# Patient Record
Sex: Female | Born: 1995 | Hispanic: Yes | Marital: Single | Smoking: Former smoker
Health system: Southern US, Community
[De-identification: ages and names within clinical notes are randomized; demographics above are authoritative.]

## PROBLEM LIST (undated history)

## (undated) DIAGNOSIS — R87619 Unspecified abnormal cytological findings in specimens from cervix uteri: Secondary | ICD-10-CM

## (undated) DIAGNOSIS — N912 Amenorrhea, unspecified: Secondary | ICD-10-CM

## (undated) HISTORY — PX: NO PAST SURGERIES: SHX2092

## (undated) HISTORY — PX: OTHER SURGICAL HISTORY: SHX169

## (undated) HISTORY — PX: COLPOSCOPY: SHX161

## (undated) HISTORY — DX: Unspecified abnormal cytological findings in specimens from cervix uteri: R87.619

## (undated) HISTORY — DX: Amenorrhea, unspecified: N91.2

---
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
## 2016-09-05 NOTE — L&D Delivery Note (Signed)
Delivery Note At 0431  a viable and healthy female """Autumn Williams""" was delivered via  (Presentation:OA ;  ).  APGAR: 8, 9  .   Placenta status:delivered intact  Cord:  with the following complications: mild boggy uterus resolved with 200 mcg cytotec placed reactally  Anesthesia:  epidural Episiotomy:  none Lacerations:  none Est. Blood Loss (mL):  350  Mom to postpartum.  Baby to Couplet care / Skin to Skin.  Jomar Denz N Seairra Otani 04/23/2017, 4:45 AM

## 2016-10-03 ENCOUNTER — Ambulatory Visit (INDEPENDENT_AMBULATORY_CARE_PROVIDER_SITE_OTHER): Payer: BLUE CROSS/BLUE SHIELD | Admitting: Certified Nurse Midwife

## 2016-10-03 ENCOUNTER — Telehealth: Payer: Self-pay

## 2016-10-03 VITALS — BP 125/84 | HR 82 | Ht 64.0 in | Wt 169.2 lb

## 2016-10-03 DIAGNOSIS — Z3401 Encounter for supervision of normal first pregnancy, first trimester: Secondary | ICD-10-CM

## 2016-10-03 DIAGNOSIS — Z3687 Encounter for antenatal screening for uncertain dates: Secondary | ICD-10-CM

## 2016-10-03 DIAGNOSIS — Z1389 Encounter for screening for other disorder: Secondary | ICD-10-CM

## 2016-10-03 DIAGNOSIS — N912 Amenorrhea, unspecified: Secondary | ICD-10-CM

## 2016-10-03 DIAGNOSIS — Z113 Encounter for screening for infections with a predominantly sexual mode of transmission: Secondary | ICD-10-CM

## 2016-10-03 NOTE — Progress Notes (Signed)
Mayer MaskerBrenda Bibb presents for NOB nurse interview visit. Pregnancy confirmation done by [facility] on 09/12/2016. UPT: positive.  G-1.  P-0. Pregnancy education material explained and given. Her room mate has a cat in the home but she does not change the litter box.  NOB labs ordered.  HIV labs and Drug screen were explained optional and she did not decline. Drug screen ordered. PNV encouraged. Genetic screening options discussed and they are undecided. Ultrasound ordered due to unsure of LMP.  Pt. To follow up with provider in 2 weeks for NOB physical.  All questions answered.

## 2016-10-03 NOTE — Patient Instructions (Addendum)
Pregnancy and Zika Virus Disease Introduction Zika virus disease, or Zika, is an illness that can spread to people from mosquitoes that carry the virus. It may also spread from person to person through infected body fluids. Zika first occurred in [place], but recently it has spread to new areas. The virus occurs in tropical climates. The location of Zika continues to change. Most people who become infected with Zika virus do not develop serious illness. However, Zika may cause birth defects in an unborn baby whose mother is infected with the virus. It may also increase the risk of miscarriage. What are the symptoms of Zika virus disease? In many cases, people who have been infected with Zika virus do not develop any symptoms. If symptoms appear, they usually start about a week after the person is infected. Symptoms are usually mild. They may include:  Fever.  Rash.  Red eyes.  Joint pain. How does Zika virus disease spread? The main way that Zika virus spreads is through the bite of a certain type of mosquito. Unlike most types of mosquitos, which bite only at night, the type of mosquito that carries Zika virus bites both at night and during the day. Zika virus can also spread through sexual contact, through a blood transfusion, and from a mother to her baby before or during birth. Once you have had Zika virus disease, it is unlikely that you will get it again. Can I pass Zika to my baby during pregnancy? Yes, Zika can pass from a mother to her baby before or during birth. What problems can Zika cause for my baby? A woman who is infected with Zika virus while pregnant is at risk of having her baby born with a condition in which the brain or head is smaller than expected (microcephaly). Babies who have microcephaly can have developmental delays, seizures, hearing problems, and vision problems. Having Zika virus disease during pregnancy can also increase the risk of miscarriage. How can Zika  virus disease be prevented? There is no vaccine to prevent Zika. The best way to prevent the disease is to avoid infected mosquitoes and avoid exposure to body fluids that can spread the virus. Avoid any possible exposure to Zika by taking the following precautions. For women and their sex partners:  Avoid traveling to high-risk areas. The locations where Zika is being reported change often. To identify high-risk areas, check the CDC travel website: www.cdc.gov/zika/geo/index.html  If you or your sex partner must travel to a high-risk area, talk with a health care provider before and after traveling.  Take all precautions to avoid mosquito bites if you live in, or travel to, any of the high-risk areas. Insect repellents are safe to use during pregnancy.  Ask your health care provider when it is safe to have sexual contact. For women:  If you are pregnant or trying to become pregnant, avoid sexual contact with persons who may have been exposed to Zika virus, persons who have possible symptoms of Zika, or persons whose history you are unsure about. If you choose to have sexual contact with someone who may have been exposed to Zika virus, use condoms correctly during the entire duration of sexual activity, every time. Do not share sexual devices, as you may be exposed to body fluids.  Ask your health care provider about when it is safe to attempt pregnancy after a possible exposure to Zika virus. What steps should I take to avoid mosquito bites? Take these steps to avoid mosquito bites when you   are in a high-risk area:  Wear loose clothing that covers your arms and legs.  Limit your outdoor activities.  Do not open windows unless they have window screens.  Sleep under mosquito nets.  Use insect repellent. The best insect repellents have:  DEET, picaridin, oil of lemon eucalyptus (OLE), or IR3535 in them.  Higher amounts of an active ingredient in them.  Remember that insect repellents  are safe to use during pregnancy.  Do not use OLE on children who are younger than 3 years of age. Do not use insect repellent on babies who are younger than 2 months of age.  Cover your child's stroller with mosquito netting. Make sure the netting fits snugly and that any loose netting does not cover your child's mouth or nose. Do not use a blanket as a mosquito-protection cover.  Do not apply insect repellent underneath clothing.  If you are using sunscreen, apply the sunscreen before applying the insect repellent.  Treat clothing with permethrin. Do not apply permethrin directly to your skin. Follow label directions for safe use.  Get rid of standing water, where mosquitoes may reproduce. Standing water is often found in items such as buckets, bowls, animal food dishes, and flowerpots. When you return from traveling to any high-risk area, continue taking actions to protect yourself against mosquito bites for 3 weeks, even if you show no signs of illness. This will prevent spreading Zika virus to uninfected mosquitoes. What should I know about the sexual transmission of Zika? People can spread Zika to their sexual partners during vaginal, anal, or oral sex, or by sharing sexual devices. Many people with Zika do not develop symptoms, so a person could spread the disease without knowing that they are infected. The greatest risk is to women who are pregnant or who may become pregnant. Zika virus can live longer in semen than it can live in blood. Couples can prevent sexual transmission of the virus by:  Using condoms correctly during the entire duration of sexual activity, every time. This includes vaginal, anal, and oral sex.  Not sharing sexual devices. Sharing increases your risk of being exposed to body fluid from another person.  Avoiding all sexual activity until your health care provider says it is safe. Should I be tested for Zika virus? A sample of your blood can be tested for Zika  virus. A pregnant woman should be tested if she may have been exposed to the virus or if she has symptoms of Zika. She may also have additional tests done during her pregnancy, such ultrasound testing. Talk with your health care provider about which tests are recommended. This information is not intended to replace advice given to you by your health care provider. Make sure you discuss any questions you have with your health care provider. Document Released: 05/13/2015 Document Revised: 01/28/2016 Document Reviewed: 05/06/2015  2017 Elsevier Minor Illnesses and Medications in Pregnancy  Cold/Flu:  Sudafed for congestion- Robitussin (plain) for cough- Tylenol for discomfort.  Please follow the directions on the label.  Try not to take any more than needed.  OTC Saline nasal spray and air humidifier or cool-mist  Vaporizer to sooth nasal irritation and to loosen congestion.  It is also important to increase intake of non carbonated fluids, especially if you have a fever.  Constipation:  Colace-2 capsules at bedtime; Metamucil- follow directions on label; Senokot- 1 tablet at bedtime.  Any one of these medications can be used.  It is also very important to increase   fluids and fruits along with regular exercise.  If problem persists please call the office.  Diarrhea:  Kaopectate as directed on the label.  Eat a bland diet and increase fluids.  Avoid highly seasoned foods.  Headache:  Tylenol 1 or 2 tablets every 3-4 hours as needed  Indigestion:  Maalox, Mylanta, Tums or Rolaids- as directed on label.  Also try to eat small meals and avoid fatty, greasy or spicy foods.  Nausea with or without Vomiting:  Nausea in pregnancy is caused by increased levels of hormones in the body which influence the digestive system and cause irritation when stomach acids accumulate.  Symptoms usually subside after 1st trimester of pregnancy.  Try the following: 1. Keep saltines, graham crackers or dry toast by your bed to  eat upon awakening. 2. Don't let your stomach get empty.  Try to eat 5-6 small meals per day instead of 3 large ones. 3. Avoid greasy fatty or highly seasoned foods.  4. Take OTC Unisom 1 tablet at bed time along with OTC Vitamin B6 25-50 mg 3 times per day.    If nausea continues with vomiting and you are unable to keep down food and fluids you may need a prescription medication.  Please notify your provider.   Sore throat:  Chloraseptic spray, throat lozenges and or plain Tylenol.  Vaginal Yeast Infection:  OTC Monistat for 7 days as directed on label.  If symptoms do not resolve within a week notify provider.  If any of the above problems do not subside with recommended treatment please call the office for further assistance.   Do not take Aspirin, Advil, Motrin or Ibuprofen.  * * OTC= Over the counter Hyperemesis Gravidarum Hyperemesis gravidarum is a severe form of nausea and vomiting that happens during pregnancy. Hyperemesis is worse than morning sickness. It may cause you to have nausea or vomiting all day for many days. It may keep you from eating and drinking enough food and liquids. Hyperemesis usually occurs during the first half (the first 20 weeks) of pregnancy. It often goes away once a woman is in her second half of pregnancy. However, sometimes hyperemesis continues through an entire pregnancy. What are the causes? The cause of this condition is not known. It may be related to changes in chemicals (hormones) in the body during pregnancy, such as the high level of pregnancy hormone (human chorionic gonadotropin) or the increase in the female sex hormone (estrogen). What are the signs or symptoms? Symptoms of this condition include:  Severe nausea and vomiting.  Nausea that does not go away.  Vomiting that does not allow you to keep any food down.  Weight loss.  Body fluid loss (dehydration).  Having no desire to eat, or not liking food that you have previously  enjoyed. How is this diagnosed? This condition may be diagnosed based on:  A physical exam.  Your medical history.  Your symptoms.  Blood tests.  Urine tests. How is this treated? This condition may be managed with medicine. If medicines to do not help relieve nausea and vomiting, you may need to receive fluids through an IV tube at the hospital. Follow these instructions at home:  Take over-the-counter and prescription medicines only as told by your health care provider.  Avoid iron pills and multivitamins that contain iron for the first 3-4 months of pregnancy. If you take prescription iron pills, do not stop taking them unless your health care provider approves.  Take the following actions to help   prevent nausea and vomiting:  In the morning, before getting out of bed, try eating a couple of dry crackers or a piece of toast.  Avoid foods and smells that upset your stomach. Fatty and spicy foods may make nausea worse.  Eat 5-6 small meals a day.  Do not drink fluids while eating meals. Drink between meals.  Eat or suck on things that have ginger in them. Ginger can help relieve nausea.  Avoid food preparation. The smell of food can spoil your appetite or trigger nausea.  Follow instructions from your health care provider about eating or drinking restrictions.  For snacks, eat high-protein foods, such as cheese.  Keep all follow-up and pre-birth (prenatal) visits as told by your health care provider. This is important. Contact a health care provider if:  You have pain in your abdomen.  You have a severe headache.  You have vision problems.  You are losing weight. Get help right away if:  You cannot drink fluids without vomiting.  You vomit blood.  You have constant nausea and vomiting.  You are very weak.  You are very thirsty.  You feel dizzy.  You faint.  You have a fever or other symptoms that last for more than 2-3 days.  You have a fever and  your symptoms suddenly get worse. Summary  Hyperemesis gravidarum is a severe form of nausea and vomiting that happens during pregnancy.  Making some changes to your eating habits may help relieve nausea and vomiting.  This condition may be managed with medicine.  If medicines to do not help relieve nausea and vomiting, you may need to receive fluids through an IV tube at the hospital. This information is not intended to replace advice given to you by your health care provider. Make sure you discuss any questions you have with your health care provider. Document Released: 08/22/2005 Document Revised: 04/20/2016 Document Reviewed: 04/20/2016 Elsevier Interactive Patient Education  2017 [employer] First Trimester of Pregnancy The first trimester of pregnancy is from week 1 until the end of week 12 (months 1 through 3). During this time, your baby will begin to develop inside you. At 6-8 weeks, the eyes and face are formed, and the heartbeat can be seen on ultrasound. At the end of 12 weeks, all the baby's organs are formed. Prenatal care is all the medical care you receive before the birth of your baby. Make sure you get good prenatal care and follow all of your doctor's instructions. Follow these instructions at home: Medicines  Take medicine only as told by your doctor. Some medicines are safe and some are not during pregnancy.  Take your prenatal vitamins as told by your doctor.  Take medicine that helps you poop (stool softener) as needed if your doctor says it is okay. Diet  Eat regular, healthy meals.  Your doctor will tell you the amount of weight gain that is right for you.  Avoid raw meat and uncooked cheese.  If you feel sick to your stomach (nauseous) or throw up (vomit):  Eat 4 or 5 small meals a day instead of 3 large meals.  Try eating a few soda crackers.  Drink liquids between meals instead of during meals.  If you have a hard time pooping  (constipation):  Eat high-fiber foods like fresh vegetables, fruit, and whole grains.  Drink enough fluids to keep your pee (urine) clear or pale yellow. Activity and Exercise  Exercise only as told by your doctor. Stop exercising if   you have cramps or pain in your lower belly (abdomen) or low back.  Try to avoid standing for long periods of time. Move your legs often if you must stand in one place for a long time.  Avoid heavy lifting.  Wear low-heeled shoes. Sit and stand up straight.  You can have sex unless your doctor tells you not to. Relief of Pain or Discomfort  Wear a good support bra if your breasts are sore.  Take warm water baths (sitz baths) to soothe pain or discomfort caused by hemorrhoids. Use hemorrhoid cream if your doctor says it is okay.  Rest with your legs raised if you have leg cramps or low back pain.  Wear support hose if you have puffy, bulging veins (varicose veins) in your legs. Raise (elevate) your feet for 15 minutes, 3-4 times a day. Limit salt in your diet. Prenatal Care  Schedule your prenatal visits by the twelfth week of pregnancy.  Write down your questions. Take them to your prenatal visits.  Keep all your prenatal visits as told by your doctor. Safety  Wear your seat belt at all times when driving.  Make a list of emergency phone numbers. The list should include numbers for family, friends, the hospital, and police and fire departments. General Tips  Ask your doctor for a referral to a local prenatal class. Begin classes no later than at the start of month 6 of your pregnancy.  Ask for help if you need counseling or help with nutrition. Your doctor can give you advice or tell you where to go for help.  Do not use hot tubs, steam rooms, or saunas.  Do not douche or use tampons or scented sanitary pads.  Do not cross your legs for long periods of time.  Avoid litter boxes and soil used by cats.  Avoid all smoking, herbs, and  alcohol. Avoid drugs not approved by your doctor.  Do not use any tobacco products, including cigarettes, chewing tobacco, and electronic cigarettes. If you need help quitting, ask your doctor. You may get counseling or other support to help you quit.  Visit your dentist. At home, brush your teeth with a soft toothbrush. Be gentle when you floss. Get help if:  You are dizzy.  You have mild cramps or pressure in your lower belly.  You have a nagging pain in your belly area.  You continue to feel sick to your stomach, throw up, or have watery poop (diarrhea).  You have a bad smelling fluid coming from your vagina.  You have pain with peeing (urination).  You have increased puffiness (swelling) in your face, hands, legs, or ankles. Get help right away if:  You have a fever.  You are leaking fluid from your vagina.  You have spotting or bleeding from your vagina.  You have very bad belly cramping or pain.  You gain or lose weight rapidly.  You throw up blood. It may look like coffee grounds.  You are around people who have German measles, fifth disease, or chickenpox.  You have a very bad headache.  You have shortness of breath.  You have any kind of trauma, such as from a fall or a car accident. This information is not intended to replace advice given to you by your health care provider. Make sure you discuss any questions you have with your health care provider. Document Released: 02/08/2008 Document Revised: 01/28/2016 Document Reviewed: 07/02/2013 Elsevier Interactive Patient Education  2017 [employer] Inc. Commonly Asked Questions   During Pregnancy  Cats: A parasite can be excreted in cat feces.  To avoid exposure you need to have another person empty the little box.  If you must empty the litter box you will need to wear gloves.  Wash your hands after handling your cat.  This parasite can also be found in raw or undercooked meat so this should also be avoided.  Colds,  Sore Throats, Flu: Please check your medication sheet to see what you can take for symptoms.  If your symptoms are unrelieved by these medications please call the office.  Dental Work: Most any dental work your dentist recommends is permitted.  X-rays should only be taken during the first trimester if absolutely necessary.  Your abdomen should be shielded with a lead apron during all x-rays.  Please notify your provider prior to receiving any x-rays.  Novocaine is fine; gas is not recommended.  If your dentist requires a note from us prior to dental work please call the office and we will provide one for you.  Exercise: Exercise is an important part of staying healthy during your pregnancy.  You may continue most exercises you were accustomed to prior to pregnancy.  Later in your pregnancy you will most likely notice you have difficulty with activities requiring balance like riding a bicycle.  It is important that you listen to your body and avoid activities that put you at a higher risk of falling.  Adequate rest and staying well hydrated are a must!  If you have questions about the safety of specific activities ask your provider.    Exposure to Children with illness: Try to avoid obvious exposure; report any symptoms to us when noted,  If you have chicken pos, red measles or mumps, you should be immune to these diseases.   Please do not take any vaccines while pregnant unless you have checked with your OB provider.  Fetal Movement: After 28 weeks we recommend you do "kick counts" twice daily.  Lie or sit down in a calm quiet environment and count your baby movements "kicks".  You should feel your baby at least 10 times per hour.  If you have not felt 10 kicks within the first hour get up, walk around and have something sweet to eat or drink then repeat for an additional hour.  If count remains less than 10 per hour notify your provider.  Fumigating: Follow your pest control agent's advice as to how long  to stay out of your home.  Ventilate the area well before re-entering.  Hemorrhoids:   Most over-the-counter preparations can be used during pregnancy.  Check your medication to see what is safe to use.  It is important to use a stool softener or fiber in your diet and to drink lots of liquids.  If hemorrhoids seem to be getting worse please call the office.   Hot Tubs:  Hot tubs Jacuzzis and saunas are not recommended while pregnant.  These increase your internal body temperature and should be avoided.  Intercourse:  Sexual intercourse is safe during pregnancy as long as you are comfortable, unless otherwise advised by your provider.  Spotting may occur after intercourse; report any bright red bleeding that is heavier than spotting.  Labor:  If you know that you are in labor, please go to the hospital.  If you are unsure, please call the office and let us help you decide what to do.  Lifting, straining, etc:  If your job requires heavy lifting or   straining please check with your provider for any limitations.  Generally, you should not lift items heavier than that you can lift simply with your hands and arms (no back muscles)  Painting:  Paint fumes do not harm your pregnancy, but may make you ill and should be avoided if possible.  Latex or water based paints have less odor than oils.  Use adequate ventilation while painting.  Permanents & Hair Color:  Chemicals in hair dyes are not recommended as they cause increase hair dryness which can increase hair loss during pregnancy.  " Highlighting" and permanents are allowed.  Dye may be absorbed differently and permanents may not hold as well during pregnancy.  Sunbathing:  Use a sunscreen, as skin burns easily during pregnancy.  Drink plenty of fluids; avoid over heating.  Tanning Beds:  Because their possible side effects are still unknown, tanning beds are not recommended.  Ultrasound Scans:  Routine ultrasounds are performed at approximately 20  weeks.  You will be able to see your baby's general anatomy an if you would like to know the gender this can usually be determined as well.  If it is questionable when you conceived you may also receive an ultrasound early in your pregnancy for dating purposes.  Otherwise ultrasound exams are not routinely performed unless there is a medical necessity.  Although you can request a scan we ask that you pay for it when conducted because insurance does not cover " patient request" scans.  Work: If your pregnancy proceeds without complications you may work until your due date, unless your physician or employer advises otherwise.  Round Ligament Pain/Pelvic Discomfort:  Sharp, shooting pains not associated with bleeding are fairly common, usually occurring in the second trimester of pregnancy.  They tend to be worse when standing up or when you remain standing for long periods of time.  These are the result of pressure of certain pelvic ligaments called "round ligaments".  Rest, Tylenol and heat seem to be the most effective relief.  As the womb and fetus grow, they rise out of the pelvis and the discomfort improves.  Please notify the office if your pain seems different than that described.  It may represent a more serious condition.   

## 2016-10-03 NOTE — Telephone Encounter (Signed)
Pt had left office without getting blood work for her NOB APPT. I left message to please if close by to come back and get those done and they need to be done before NOB physical.

## 2016-10-04 LAB — CBC WITH DIFFERENTIAL/PLATELET
Basophils Absolute: 0 10*3/uL (ref 0.0–0.2)
Basos: 0 %
EOS (ABSOLUTE): 0 10*3/uL (ref 0.0–0.4)
Eos: 0 %
HEMOGLOBIN: 14.9 g/dL (ref 11.1–15.9)
Hematocrit: 43 % (ref 34.0–46.6)
Immature Grans (Abs): 0 10*3/uL (ref 0.0–0.1)
Immature Granulocytes: 0 %
LYMPHS ABS: 1.5 10*3/uL (ref 0.7–3.1)
Lymphs: 19 %
MCH: 31.8 pg (ref 26.6–33.0)
MCHC: 34.7 g/dL (ref 31.5–35.7)
MCV: 92 fL (ref 79–97)
MONOCYTES: 8 %
MONOS ABS: 0.6 10*3/uL (ref 0.1–0.9)
Neutrophils Absolute: 5.5 10*3/uL (ref 1.4–7.0)
Neutrophils: 73 %
Platelets: 203 10*3/uL (ref 150–379)
RBC: 4.68 x10E6/uL (ref 3.77–5.28)
RDW: 12.5 % (ref 12.3–15.4)
WBC: 7.6 10*3/uL (ref 3.4–10.8)

## 2016-10-04 LAB — RH TYPE: Rh Factor: POSITIVE

## 2016-10-04 LAB — VARICELLA ZOSTER ANTIBODY, IGG: Varicella zoster IgG: <135 index — ABNORMAL LOW (ref >165)

## 2016-10-04 LAB — ANTIBODY SCREEN: ANTIBODY SCREEN: NEGATIVE

## 2016-10-04 LAB — ABO

## 2016-10-04 LAB — RUBELLA SCREEN: RUBELLA: 16.3 {index} (ref >0.99)

## 2016-10-04 LAB — HIV ANTIBODY (ROUTINE TESTING W REFLEX): HIV Screen 4th Generation wRfx: NONREACTIVE

## 2016-10-04 LAB — RPR: RPR Ser Ql: NONREACTIVE

## 2016-10-04 LAB — HEPATITIS B SURFACE ANTIGEN: HEP B S AG: NEGATIVE

## 2016-10-04 NOTE — Telephone Encounter (Signed)
Pt had NOB LABS done yesterday.

## 2016-10-05 LAB — MONITOR DRUG PROFILE 14(MW)
AMPHETAMINE SCREEN URINE: NEGATIVE ng/mL
BARBITURATE SCREEN URINE: NEGATIVE ng/mL
BENZODIAZEPINE SCREEN, URINE: NEGATIVE ng/mL
BUPRENORPHINE, URINE: NEGATIVE ng/mL
CANNABINOIDS UR QL SCN: NEGATIVE ng/mL
COCAINE(METAB.)SCREEN, URINE: NEGATIVE ng/mL
Creatinine(Crt), U: 97 mg/dL (ref 20.0–300.0)
FENTANYL, URINE: NEGATIVE pg/mL
MEPERIDINE SCREEN, URINE: NEGATIVE ng/mL
Methadone Screen, Urine: NEGATIVE ng/mL
OPIATE SCREEN URINE: NEGATIVE ng/mL
OXYCODONE+OXYMORPHONE UR QL SCN: NEGATIVE ng/mL
PHENCYCLIDINE QUANTITATIVE URINE: NEGATIVE ng/mL
PROPOXYPHENE SCREEN URINE: NEGATIVE ng/mL
Ph of Urine: 7.6 (ref 4.5–8.9)
SPECIFIC GRAVITY: 1.024
TRAMADOL SCREEN, URINE: NEGATIVE ng/mL

## 2016-10-05 LAB — NICOTINE SCREEN, URINE: Cotinine Ql Scrn, Ur: NEGATIVE ng/mL

## 2016-10-05 LAB — URINALYSIS, ROUTINE W REFLEX MICROSCOPIC
BILIRUBIN UA: NEGATIVE
GLUCOSE, UA: NEGATIVE
KETONES UA: NEGATIVE
Leukocytes, UA: NEGATIVE
NITRITE UA: NEGATIVE
Protein, UA: NEGATIVE
RBC UA: NEGATIVE
SPEC GRAV UA: 1.018 (ref 1.005–1.030)
UUROB: 0.2 mg/dL (ref 0.2–1.0)
pH, UA: 7.5 (ref 5.0–7.5)

## 2016-10-05 LAB — URINE CULTURE, OB REFLEX

## 2016-10-05 LAB — GC/CHLAMYDIA PROBE AMP
CHLAMYDIA, DNA PROBE: NEGATIVE
Neisseria gonorrhoeae by PCR: NEGATIVE

## 2016-10-05 LAB — CULTURE, OB URINE

## 2016-10-12 ENCOUNTER — Ambulatory Visit (INDEPENDENT_AMBULATORY_CARE_PROVIDER_SITE_OTHER): Payer: BLUE CROSS/BLUE SHIELD

## 2016-10-12 DIAGNOSIS — Z3687 Encounter for antenatal screening for uncertain dates: Secondary | ICD-10-CM | POA: Diagnosis not present

## 2016-10-12 DIAGNOSIS — Z3401 Encounter for supervision of normal first pregnancy, first trimester: Secondary | ICD-10-CM | POA: Diagnosis not present

## 2016-10-21 ENCOUNTER — Ambulatory Visit (INDEPENDENT_AMBULATORY_CARE_PROVIDER_SITE_OTHER): Payer: BLUE CROSS/BLUE SHIELD | Admitting: Certified Nurse Midwife

## 2016-10-21 ENCOUNTER — Encounter: Payer: Self-pay | Admitting: Certified Nurse Midwife

## 2016-10-21 VITALS — BP 112/59 | HR 95 | Wt 164.4 lb

## 2016-10-21 DIAGNOSIS — Z3401 Encounter for supervision of normal first pregnancy, first trimester: Secondary | ICD-10-CM

## 2016-10-21 DIAGNOSIS — Z23 Encounter for immunization: Secondary | ICD-10-CM | POA: Diagnosis not present

## 2016-10-21 LAB — POCT URINALYSIS DIPSTICK
Bilirubin, UA: NEGATIVE
Blood, UA: NEGATIVE
GLUCOSE UA: NEGATIVE
Ketones, UA: NEGATIVE
LEUKOCYTES UA: NEGATIVE
NITRITE UA: NEGATIVE
PROTEIN UA: NEGATIVE
Spec Grav, UA: 1.01
UROBILINOGEN UA: NEGATIVE
pH, UA: 7

## 2016-10-21 NOTE — Progress Notes (Signed)
NEW OB HISTORY AND PHYSICAL  SUBJECTIVE:       Autumn Williams is a 21 y.o. G1P0 female, Patient's last menstrual period was 07/24/2016 (approximate)., Estimated Date of Delivery: 04/30/17, [redacted]w[redacted]d, presents today for establishment of Prenatal Care.  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   Denies difficulty breathing or respiratory distress, chest pain, abdominal pain, vaginal bleeding, and leg pain or swelling.    Gynecologic History  Patient's last menstrual period was 07/24/2016 (approximate).  Obstetric History OB History  Gravida Para Term Preterm AB Living  1            SAB TAB Ectopic Multiple Live Births               # Outcome Date GA Lbr Len/2nd Weight Sex Delivery Anes PTL Lv  1 Current               Past Medical History:  Diagnosis Date  . Amenorrhea     Past Surgical History:  Procedure Laterality Date  . none      Current Outpatient Prescriptions on File Prior to Visit  Medication Sig Dispense Refill  . Prenatal Vit-DSS-Fe Cbn-FA (PRENA-CAP PO) Take by mouth.     No current facility-administered medications on file prior to visit.     No Known Allergies  Social History   Social History  . Marital status: Single    Spouse name: N/A  . Number of children: N/A  . Years of education: N/A   Occupational History  . Not on file.   Social History Main Topics  . Smoking status: Former Smoker    Types: Cigarettes    Quit date: 08/2016  . Smokeless tobacco: Never Used  . Alcohol use Yes     Comment: occ  . Drug use: No  . Sexual activity: Yes    Partners: Male    Birth control/ protection: None   Other Topics Concern  . Not on file   Social History Narrative  . No narrative on file    Family History  Problem Relation Age of Onset  . Cancer Neg Hx   . Diabetes Neg Hx   . Heart failure Neg Hx     The following  portions of the patient's history were reviewed and updated as appropriate: allergies, current medications, past OB history, past medical history, past surgical history, past family history, past social history, and problem list.  OBJECTIVE: Initial Physical Exam (New OB)  GENERAL APPEARANCE: alert, well appearing, in no apparent distress  HEAD: normocephalic, atraumatic  MOUTH: mucous membranes moist, pharynx normal without lesions and dental hygiene good  THYROID: no thyromegaly or masses present  BREASTS: no masses noted, no significant tenderness, no palpable axillary nodes, no skin changes  LUNGS: clear to auscultation, no wheezes, rales or rhonchi, symmetric air entry  HEART: regular rate and rhythm, no murmurs  ABDOMEN: soft, nontender, nondistended, no abnormal masses, no epigastric pain, fundus soft, nontender 12 weeks size and FHT present  EXTREMITIES: no redness or tenderness in the calves or thighs, no edema  SKIN: normal coloration and turgor, no rashes  LYMPH NODES: no adenopathy palpable  NEUROLOGIC: alert, oriented, normal speech, no focal findings or movement disorder noted  PELVIC EXAM EXTERNAL GENITALIA: normal appearing vulva with no masses, tenderness or lesions VAGINA: no abnormal discharge or lesions CERVIX: no lesions or cervical motion tenderness  UTERUS: gravid and consistent with 12 weeks ADNEXA: no masses palpable and nontender  ASSESSMENT: Normal pregnancy Desires genetic screening  PLAN: Prenatal care Panorama today Discussed healthy eating and pregnancy weight gain Reviewed pregnancy safe medications Reviewed red flag symptoms and when to call RTC X 4 weeks for ROB See orders   Gunnar BullaJenkins Autumn Williams, CNM

## 2016-10-21 NOTE — Patient Instructions (Addendum)
Second Trimester of Pregnancy The second trimester is from week 13 through week 28, month 4 through 6. This is often the time in pregnancy that you feel your best. Often times, morning sickness has lessened or quit. You may have more energy, and you may get hungry more often. Your unborn baby (fetus) is growing rapidly. At the end of the sixth month, he or she is about 9 inches long and weighs about 1 pounds. You will likely feel the baby move (quickening) between 18 and 20 weeks of pregnancy. Follow these instructions at home:  Avoid all smoking, herbs, and alcohol. Avoid drugs not approved by your doctor.  Do not use any tobacco products, including cigarettes, chewing tobacco, and electronic cigarettes. If you need help quitting, ask your doctor. You may get counseling or other support to help you quit.  Only take medicine as told by your doctor. Some medicines are safe and some are not during pregnancy.  Exercise only as told by your doctor. Stop exercising if you start having cramps.  Eat regular, healthy meals.  Wear a good support bra if your breasts are tender.  Do not use hot tubs, steam rooms, or saunas.  Wear your seat belt when driving.  Avoid raw meat, uncooked cheese, and liter boxes and soil used by cats.  Take your prenatal vitamins.  Take 1500-2000 milligrams of calcium daily starting at the 20th week of pregnancy until you deliver your baby.  Try taking medicine that helps you poop (stool softener) as needed, and if your doctor approves. Eat more fiber by eating fresh fruit, vegetables, and whole grains. Drink enough fluids to keep your pee (urine) clear or pale yellow.  Take warm water baths (sitz baths) to soothe pain or discomfort caused by hemorrhoids. Use hemorrhoid cream if your doctor approves.  If you have puffy, bulging veins (varicose veins), wear support hose. Raise (elevate) your feet for 15 minutes, 3-4 times a day. Limit salt in your diet.  Avoid heavy  lifting, wear low heals, and sit up straight.  Rest with your legs raised if you have leg cramps or low back pain.  Visit your dentist if you have not gone during your pregnancy. Use a soft toothbrush to brush your teeth. Be gentle when you floss.  You can have sex (intercourse) unless your doctor tells you not to.  Go to your doctor visits. Get help if:  You feel dizzy.  You have mild cramps or pressure in your lower belly (abdomen).  You have a nagging pain in your belly area.  You continue to feel sick to your stomach (nauseous), throw up (vomit), or have watery poop (diarrhea).  You have bad smelling fluid coming from your vagina.  You have pain with peeing (urination). Get help right away if:  You have a fever.  You are leaking fluid from your vagina.  You have spotting or bleeding from your vagina.  You have severe belly cramping or pain.  You lose or gain weight rapidly.  You have trouble catching your breath and have chest pain.  You notice sudden or extreme puffiness (swelling) of your face, hands, ankles, feet, or legs.  You have not felt the baby move in over an hour.  You have severe headaches that do not go away with medicine.  You have vision changes. This information is not intended to replace advice given to you by your health care provider. Make sure you discuss any questions you have with your health care   provider. Document Released: 11/16/2009 Document Revised: 01/28/2016 Document Reviewed: 10/23/2012 Elsevier Interactive Patient Education  2017 Elsevier Inc.   Minor Illnesses and Medications in Pregnancy  Cold/Flu:  Sudafed for congestion- Robitussin (plain) for cough- Tylenol for discomfort.  Please follow the directions on the label.  Try not to take any more than needed.  OTC Saline nasal spray and air humidifier or cool-mist  Vaporizer to sooth nasal irritation and to loosen congestion.  It is also important to increase intake of non  carbonated fluids, especially if you have a fever.  Constipation:  Colace-2 capsules at bedtime; Metamucil- follow directions on label; Senokot- 1 tablet at bedtime.  Any one of these medications can be used.  It is also very important to increase fluids and fruits along with regular exercise.  If problem persists please call the office.  Diarrhea:  Kaopectate as directed on the label.  Eat a bland diet and increase fluids.  Avoid highly seasoned foods.  Headache:  Tylenol 1 or 2 tablets every 3-4 hours as needed  Indigestion:  Maalox, Mylanta, Tums or Rolaids- as directed on label.  Also try to eat small meals and avoid fatty, greasy or spicy foods.  Nausea with or without Vomiting:  Nausea in pregnancy is caused by increased levels of hormones in the body which influence the digestive system and cause irritation when stomach acids accumulate.  Symptoms usually subside after 1st trimester of pregnancy.  Try the following: 1. Keep saltines, graham crackers or dry toast by your bed to eat upon awakening. 2. Don't let your stomach get empty.  Try to eat 5-6 small meals per day instead of 3 large ones. 3. Avoid greasy fatty or highly seasoned foods.  4. Take OTC Unisom 1 tablet at bed time along with OTC Vitamin B6 25-50 mg 3 times per day.    If nausea continues with vomiting and you are unable to keep down food and fluids you may need a prescription medication.  Please notify your provider.   Sore throat:  Chloraseptic spray, throat lozenges and or plain Tylenol.  Vaginal Yeast Infection:  OTC Monistat for 7 days as directed on label.  If symptoms do not resolve within a week notify provider.  If any of the above problems do not subside with recommended treatment please call the office for further assistance.   Do not take Aspirin, Advil, Motrin or Ibuprofen.  * * OTC= Over the counter

## 2016-10-21 NOTE — Progress Notes (Signed)
NOB PE- Nauseous. Taking b6 once a day.

## 2016-10-31 ENCOUNTER — Telehealth: Payer: Self-pay | Admitting: Certified Nurse Midwife

## 2016-10-31 ENCOUNTER — Encounter: Payer: Self-pay | Admitting: Certified Nurse Midwife

## 2016-10-31 NOTE — Telephone Encounter (Signed)
Called pt, verified full name and date of birth.   Reviewed results of genetic screening, excluding gender. Low risk.   Gender placed in envelope at front desk. Will be picked up by [facility].    Autumn BullaJenkins Michelle Aeon Koors, CNM

## 2016-11-17 ENCOUNTER — Ambulatory Visit (INDEPENDENT_AMBULATORY_CARE_PROVIDER_SITE_OTHER): Payer: BLUE CROSS/BLUE SHIELD | Admitting: Certified Nurse Midwife

## 2016-11-17 ENCOUNTER — Encounter: Payer: Self-pay | Admitting: Certified Nurse Midwife

## 2016-11-17 VITALS — BP 108/77 | HR 88 | Wt 171.8 lb

## 2016-11-17 DIAGNOSIS — Z3402 Encounter for supervision of normal first pregnancy, second trimester: Secondary | ICD-10-CM

## 2016-11-17 DIAGNOSIS — O219 Vomiting of pregnancy, unspecified: Secondary | ICD-10-CM

## 2016-11-17 LAB — POCT URINALYSIS DIPSTICK
BILIRUBIN UA: NEGATIVE
Glucose, UA: NEGATIVE
KETONES UA: NEGATIVE
Leukocytes, UA: NEGATIVE
Nitrite, UA: NEGATIVE
PH UA: 6.5 (ref 5.0–8.0)
Protein, UA: NEGATIVE
RBC UA: NEGATIVE
Spec Grav, UA: 1.02 (ref 1.030–1.035)
Urobilinogen, UA: 0.2 (ref ?–2.0)

## 2016-11-17 MED ORDER — ONDANSETRON 4 MG PO TBDP: 4.0000 mg | ORAL_TABLET | Freq: Three times a day (TID) | ORAL | 1 refills | Status: DC | PRN

## 2016-11-17 NOTE — Progress Notes (Signed)
ROB-Pt doing well. Reports nausea with two (2) episodes of vomiting daily. No relief with B6 and unisom. Reviewed red flag symptoms and when to call. RTC x 4 weeks for anatomy scan and ROB.

## 2016-11-17 NOTE — Patient Instructions (Signed)
Round Ligament Pain The round ligament is a cord of muscle and tissue that helps to support the uterus. It can become a source of pain during pregnancy if it becomes stretched or twisted as the baby grows. The pain usually begins in the second trimester of pregnancy, and it can come and go until the baby is delivered. It is not a serious problem, and it does not cause harm to the baby. Round ligament pain is usually a short, sharp, and pinching pain, but it can also be a dull, lingering, and aching pain. The pain is felt in the lower side of the abdomen or in the groin. It usually starts deep in the groin and moves up to the outside of the hip area. Pain can occur with:  A sudden change in position.  Rolling over in bed.  Coughing or sneezing.  Physical activity. Follow these instructions at home: Watch your condition for any changes. Take these steps to help with your pain:  When the pain starts, relax. Then try:  Sitting down.  Flexing your knees up to your abdomen.  Lying on your side with one pillow under your abdomen and another pillow between your legs.  Sitting in a warm bath for 15-20 minutes or until the pain goes away.  Take over-the-counter and prescription medicines only as told by your health care provider.  Move slowly when you sit and stand.  Avoid long walks if they cause pain.  Stop or lessen your physical activities if they cause pain. Contact a health care provider if:  Your pain does not go away with treatment.  You feel pain in your back that you did not have before.  Your medicine is not helping. Get help right away if:  You develop a fever or chills.  You develop uterine contractions.  You develop vaginal bleeding.  You develop nausea or vomiting.  You develop diarrhea.  You have pain when you urinate. This information is not intended to replace advice given to you by your health care provider. Make sure you discuss any questions you have with  your health care provider. Document Released: 05/31/2008 Document Revised: 01/28/2016 Document Reviewed: 10/29/2014 Elsevier Interactive Patient Education  2017 Elsevier Inc.  

## 2016-12-11 ENCOUNTER — Encounter: Payer: Self-pay | Admitting: Certified Nurse Midwife

## 2016-12-15 ENCOUNTER — Other Ambulatory Visit: Payer: BLUE CROSS/BLUE SHIELD

## 2016-12-15 ENCOUNTER — Encounter: Payer: BLUE CROSS/BLUE SHIELD | Admitting: Certified Nurse Midwife

## 2016-12-16 ENCOUNTER — Other Ambulatory Visit: Payer: Self-pay | Admitting: Certified Nurse Midwife

## 2016-12-16 ENCOUNTER — Encounter: Payer: Self-pay | Admitting: Certified Nurse Midwife

## 2016-12-16 ENCOUNTER — Ambulatory Visit (INDEPENDENT_AMBULATORY_CARE_PROVIDER_SITE_OTHER): Payer: BLUE CROSS/BLUE SHIELD | Admitting: Certified Nurse Midwife

## 2016-12-16 ENCOUNTER — Ambulatory Visit (INDEPENDENT_AMBULATORY_CARE_PROVIDER_SITE_OTHER): Payer: BLUE CROSS/BLUE SHIELD

## 2016-12-16 VITALS — BP 96/67 | HR 74 | Wt 175.0 lb

## 2016-12-16 DIAGNOSIS — Z3402 Encounter for supervision of normal first pregnancy, second trimester: Secondary | ICD-10-CM

## 2016-12-16 DIAGNOSIS — Z3403 Encounter for supervision of normal first pregnancy, third trimester: Secondary | ICD-10-CM

## 2016-12-16 DIAGNOSIS — Z369 Encounter for antenatal screening, unspecified: Secondary | ICD-10-CM | POA: Diagnosis not present

## 2016-12-16 DIAGNOSIS — O283 Abnormal ultrasonic finding on antenatal screening of mother: Secondary | ICD-10-CM

## 2016-12-16 LAB — POCT URINALYSIS DIPSTICK
Bilirubin, UA: NEGATIVE
GLUCOSE UA: NEGATIVE
Ketones, UA: NEGATIVE
Leukocytes, UA: NEGATIVE
NITRITE UA: NEGATIVE
PH UA: 7.5 (ref 5.0–8.0)
RBC UA: NEGATIVE
SPEC GRAV UA: 1.01 (ref 1.010–1.025)
UROBILINOGEN UA: NEGATIVE U/dL — AB

## 2016-12-16 NOTE — Progress Notes (Signed)
Rob and anatomy. Anatomy incomplete.

## 2016-12-16 NOTE — Patient Instructions (Signed)
Round Ligament Pain The round ligament is a cord of muscle and tissue that helps to support the uterus. It can become a source of pain during pregnancy if it becomes stretched or twisted as the baby grows. The pain usually begins in the second trimester of pregnancy, and it can come and go until the baby is delivered. It is not a serious problem, and it does not cause harm to the baby. Round ligament pain is usually a short, sharp, and pinching pain, but it can also be a dull, lingering, and aching pain. The pain is felt in the lower side of the abdomen or in the groin. It usually starts deep in the groin and moves up to the outside of the hip area. Pain can occur with:  A sudden change in position.  Rolling over in bed.  Coughing or sneezing.  Physical activity. Follow these instructions at home: Watch your condition for any changes. Take these steps to help with your pain:  When the pain starts, relax. Then try:  Sitting down.  Flexing your knees up to your abdomen.  Lying on your side with one pillow under your abdomen and another pillow between your legs.  Sitting in a warm bath for 15-20 minutes or until the pain goes away.  Take over-the-counter and prescription medicines only as told by your health care provider.  Move slowly when you sit and stand.  Avoid long walks if they cause pain.  Stop or lessen your physical activities if they cause pain. Contact a health care provider if:  Your pain does not go away with treatment.  You feel pain in your back that you did not have before.  Your medicine is not helping. Get help right away if:  You develop a fever or chills.  You develop uterine contractions.  You develop vaginal bleeding.  You develop nausea or vomiting.  You develop diarrhea.  You have pain when you urinate. This information is not intended to replace advice given to you by your health care provider. Make sure you discuss any questions you have with  your health care provider. Document Released: 05/31/2008 Document Revised: 01/28/2016 Document Reviewed: 10/29/2014 Elsevier Interactive Patient Education  2017 Elsevier Inc.  

## 2016-12-18 DIAGNOSIS — O283 Abnormal ultrasonic finding on antenatal screening of mother: Secondary | ICD-10-CM | POA: Insufficient documentation

## 2016-12-18 NOTE — Progress Notes (Signed)
Autumn Williams-Pt doing well. Discussed round ligament pain and back pain in pregnancy, encouraged home treatment measures. Reviewed US findings, incomplete anatomy and left ventricle echogenic focus. Referral to [facility]. Last day of this school term is May 10. Reviewed red flag symptoms and when to call. RTC x 4 weeks for Autumn Williams.   ULTRASOUND REPORT  Location: [facility] Date of Service: 12/16/16  Indications: Anatomy Findings:  Singleton intrauterine pregnancy is visualized with FHR at 144 BPM. Biometrics give an (U/S) Gestational age of [redacted] weeks and 4 days, and an (U/S) EDD of 05/01/17; this correlates with the clinically established EDD of 04/30/17.  Fetal presentation is breech, spine anterior.  EFW: 369 grams ( 0 lbs. 13 oz.). Placenta: Posterior, grade 1, and remote to cervix by 5.7 cm. AFI: Subjectively adequate with an MVP of 4.9 cm.  Anatomic survey is incomplete due to fetal position. There is an echogenic focus located in the left ventricle of the fetal heart. All other anatomy seen today appears WNL. Gender - Female.   Right Ovary measures 3.6 x 3.0 x 1.6 cm, and appears WNL. Left Ovary measures 2.7 x 1.3 x 1.8 cm, and appears WNL. There is no evidence of a corpus luteal cyst. Survey of the adnexa demonstrates no adnexal masses. There is no free peritoneal fluid in the cul de sac.  Impression: 1. 20 weeks 4 days Viable Singleton Intrauterine pregnancy by U/S. 2. (U/S) EDD is consistent with Clinically established (LMP) EDD of 04/30/17. 3. Incomplete anatomy scan due to fetal position. 4. Echogenic focus in left ventricle.  Recommendations: 1.Clinical correlation with the patient's History and Physical Exam. 2. Patient to return in 1-2 weeks for follow up ultrasound.

## 2016-12-21 ENCOUNTER — Other Ambulatory Visit: Payer: Self-pay | Admitting: Certified Nurse Midwife

## 2016-12-21 DIAGNOSIS — Z3689 Encounter for other specified antenatal screening: Secondary | ICD-10-CM

## 2016-12-29 ENCOUNTER — Ambulatory Visit
Admission: RE | Admit: 2016-12-29 | Discharge: 2016-12-29 | Disposition: A | Discharge status: Home / Self Care | Payer: Medicaid Other | Source: Ambulatory Visit | Attending: Maternal & Fetal Medicine | Admitting: Maternal & Fetal Medicine

## 2016-12-29 DIAGNOSIS — Z3689 Encounter for other specified antenatal screening: Secondary | ICD-10-CM | POA: Diagnosis not present

## 2016-12-29 DIAGNOSIS — Z3A22 22 weeks gestation of pregnancy: Secondary | ICD-10-CM | POA: Diagnosis not present

## 2016-12-31 ENCOUNTER — Encounter: Payer: Self-pay | Admitting: Certified Nurse Midwife

## 2017-01-13 ENCOUNTER — Ambulatory Visit (INDEPENDENT_AMBULATORY_CARE_PROVIDER_SITE_OTHER): Payer: BLUE CROSS/BLUE SHIELD | Admitting: Certified Nurse Midwife

## 2017-01-13 ENCOUNTER — Encounter: Payer: Self-pay | Admitting: Certified Nurse Midwife

## 2017-01-13 VITALS — BP 124/80 | HR 93 | Wt 184.1 lb

## 2017-01-13 DIAGNOSIS — Z3492 Encounter for supervision of normal pregnancy, unspecified, second trimester: Secondary | ICD-10-CM

## 2017-01-13 LAB — POCT URINALYSIS DIPSTICK
Bilirubin, UA: NEGATIVE
Blood, UA: NEGATIVE
Glucose, UA: NEGATIVE
Ketones, UA: NEGATIVE
LEUKOCYTES UA: NEGATIVE
NITRITE UA: NEGATIVE
PH UA: 8 (ref 5.0–8.0)
Spec Grav, UA: 1.01 (ref 1.010–1.025)
Urobilinogen, UA: 0.2 E.U./dL

## 2017-01-13 NOTE — Patient Instructions (Signed)

## 2017-01-13 NOTE — Progress Notes (Signed)
Rob, doing well. She states that she has cramping occasionally. Reviewed normal cramping and PTL, encouraged adequate PO hydration. Discussed fetal movement in relationship to wellbeing. Glucose tolerance test at next visit. Pt to follow up in 4 wks.   Doreene BurkeAnnie Breylin Dom, CNM

## 2017-01-13 NOTE — Progress Notes (Signed)
ROB-Pt is doing well.  

## 2017-01-17 ENCOUNTER — Encounter: Payer: Self-pay | Admitting: Certified Nurse Midwife

## 2017-02-02 ENCOUNTER — Telehealth: Payer: Self-pay | Admitting: Obstetrics and Gynecology

## 2017-02-02 NOTE — Telephone Encounter (Signed)
Patient called and stated that she has a lot of swelling and pain in her hands and feet, and is 7 months, Patient would like to speak with a nurse or provider, and possibly schedule an appointment. Please advise.

## 2017-02-03 NOTE — Telephone Encounter (Signed)
Please call patient after 3pm today. °

## 2017-02-06 ENCOUNTER — Encounter: Payer: Self-pay | Admitting: Certified Nurse Midwife

## 2017-02-06 NOTE — Telephone Encounter (Signed)
Sent pt message throught my chart

## 2017-02-14 ENCOUNTER — Other Ambulatory Visit: Payer: BLUE CROSS/BLUE SHIELD

## 2017-02-14 ENCOUNTER — Ambulatory Visit (INDEPENDENT_AMBULATORY_CARE_PROVIDER_SITE_OTHER): Payer: BLUE CROSS/BLUE SHIELD | Admitting: Obstetrics and Gynecology

## 2017-02-14 VITALS — BP 123/79 | HR 82 | Wt 189.0 lb

## 2017-02-14 DIAGNOSIS — Z23 Encounter for immunization: Secondary | ICD-10-CM | POA: Diagnosis not present

## 2017-02-14 DIAGNOSIS — Z131 Encounter for screening for diabetes mellitus: Secondary | ICD-10-CM

## 2017-02-14 DIAGNOSIS — Z3493 Encounter for supervision of normal pregnancy, unspecified, third trimester: Secondary | ICD-10-CM

## 2017-02-14 DIAGNOSIS — G5601 Carpal tunnel syndrome, right upper limb: Secondary | ICD-10-CM

## 2017-02-14 LAB — POCT URINALYSIS DIPSTICK
BILIRUBIN UA: NEGATIVE
GLUCOSE UA: NEGATIVE
Ketones, UA: NEGATIVE
Leukocytes, UA: NEGATIVE
NITRITE UA: NEGATIVE
Protein, UA: NEGATIVE
RBC UA: NEGATIVE
Spec Grav, UA: 1.01 (ref 1.010–1.025)
Urobilinogen, UA: 0.2 E.U./dL
pH, UA: 6.5 (ref 5.0–8.0)

## 2017-02-14 MED ORDER — TETANUS-DIPHTH-ACELL PERTUSSIS 5-2.5-18.5 LF-MCG/0.5 IM SUSP
0.5000 mL | Freq: Once | INTRAMUSCULAR | Status: AC
  Administered 2017-02-14: 0.5 mL via INTRAMUSCULAR

## 2017-02-14 NOTE — Progress Notes (Signed)
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 [facility].

## 2017-02-14 NOTE — Progress Notes (Signed)
ROB- glucola done, blood consent signed,tdap given Pt is doing well 

## 2017-02-14 NOTE — Patient Instructions (Signed)
Carpal Tunnel Syndrome Carpal tunnel syndrome is a condition that causes pain in your hand and arm. The carpal tunnel is a narrow area located on the palm side of your wrist. Repeated wrist motion or certain diseases may cause swelling within the tunnel. This swelling pinches the main nerve in the wrist (median nerve). What are the causes? This condition may be caused by:  Repeated wrist motions.  Wrist injuries.  Arthritis.  A cyst or tumor in the carpal tunnel.  Fluid buildup during pregnancy.  Sometimes the cause of this condition is not known. What increases the risk? This condition is more likely to develop in:  People who have jobs that cause them to repeatedly move their wrists in the same motion, such as Health visitor.  Women.  People with certain conditions, such as: ? Diabetes. ? Obesity. ? An underactive thyroid (hypothyroidism). ? Kidney failure.  What are the signs or symptoms? Symptoms of this condition include:  A tingling feeling in your fingers, especially in your thumb, index, and middle fingers.  Tingling or numbness in your hand.  An aching feeling in your entire arm, especially when your wrist and elbow are bent for long periods of time.  Wrist pain that goes up your arm to your shoulder.  Pain that goes down into your palm or fingers.  A weak feeling in your hands. You may have trouble grabbing and holding items.  Your symptoms may feel worse during the night. How is this diagnosed? This condition is diagnosed with a medical history and physical exam. You may also have tests, including:  An electromyogram (EMG). This test measures electrical signals sent by your nerves into the muscles.  X-rays.  How is this treated? Treatment for this condition includes:  Lifestyle changes. It is important to stop doing or modify the activity that caused your condition.  Physical or occupational therapy.  Medicines for pain and inflammation.  This may include medicine that is injected into your wrist.  A wrist splint.  Surgery.  Follow these instructions at home: If you have a splint:  Wear it as told by your health care provider. Remove it only as told by your health care provider.  Loosen the splint if your fingers become numb and tingle, or if they turn cold and blue.  Keep the splint clean and dry. General instructions  Take over-the-counter and prescription medicines only as told by your health care provider.  Rest your wrist from any activity that may be causing your pain. If your condition is work related, talk to your employer about changes that can be made, such as getting a wrist pad to use while typing.  If directed, apply ice to the painful area: ? Put ice in a plastic bag. ? Place a towel between your skin and the bag. ? Leave the ice on for 20 minutes, 2-3 times per day.  Keep all follow-up visits as told by your health care provider. This is important.  Do any exercises as told by your health care provider, physical therapist, or occupational therapist. Contact a health care provider if:  You have new symptoms.  Your pain is not controlled with medicines.  Your symptoms get worse. This information is not intended to replace advice given to you by your health care provider. Make sure you discuss any questions you have with your health care provider. Document Released: 08/19/2000 Document Revised: 12/31/2015 Document Reviewed: 01/07/2015 Elsevier Interactive Patient Education  2017 [employer]. Third Trimester of  Pregnancy The third trimester is from week 28 through week 40 (months 7 through 9). The third trimester is a time when the unborn baby (fetus) is growing rapidly. At the end of the ninth month, the fetus is about 20 inches in length and weighs 6-10 pounds. Body changes during your third trimester Your body will continue to go through many changes during pregnancy. The changes vary from  woman to woman. During the third trimester:  Your weight will continue to increase. You can expect to gain 25-35 pounds (11-16 kg) by the end of the pregnancy.  You may begin to get stretch marks on your hips, abdomen, and breasts.  You may urinate more often because the fetus is moving lower into your pelvis and pressing on your bladder.  You may develop or continue to have heartburn. This is caused by increased hormones that slow down muscles in the digestive tract.  You may develop or continue to have constipation because increased hormones slow digestion and cause the muscles that push waste through your intestines to relax.  You may develop hemorrhoids. These are swollen veins (varicose veins) in the rectum that can itch or be painful.  You may develop swollen, bulging veins (varicose veins) in your legs.  You may have increased body aches in the pelvis, back, or thighs. This is due to weight gain and increased hormones that are relaxing your joints.  You may have changes in your hair. These can include thickening of your hair, rapid growth, and changes in texture. Some women also have hair loss during or after pregnancy, or hair that feels dry or thin. Your hair will most likely return to normal after your baby is born.  Your breasts will continue to grow and they will continue to become tender. A yellow fluid (colostrum) may leak from your breasts. This is the first milk you are producing for your baby.  Your belly button may stick out.  You may notice more swelling in your hands, face, or ankles.  You may have increased tingling or numbness in your hands, arms, and legs. The skin on your belly may also feel numb.  You may feel short of breath because of your expanding uterus.  You may have more problems sleeping. This can be caused by the size of your belly, increased need to urinate, and an increase in your body's metabolism.  You may notice the fetus "dropping," or moving  lower in your abdomen (lightening).  You may have increased vaginal discharge.  You may notice your joints feel loose and you may have pain around your pelvic bone.  What to expect at prenatal visits You will have prenatal exams every 2 weeks until week 36. Then you will have weekly prenatal exams. During a routine prenatal visit:  You will be weighed to make sure you and the baby are growing normally.  Your blood pressure will be taken.  Your abdomen will be measured to track your baby's growth.  The fetal heartbeat will be listened to.  Any test results from the previous visit will be discussed.  You may have a cervical check near your due date to see if your cervix has softened or thinned (effaced).  You will be tested for Group B streptococcus. This happens between 35 and 37 weeks.  Your health care provider may ask you:  What your birth plan is.  How you are feeling.  If you are feeling the baby move.  If you have had any abnormal  symptoms, such as leaking fluid, bleeding, severe headaches, or abdominal cramping.  If you are using any tobacco products, including cigarettes, chewing tobacco, and electronic cigarettes.  If you have any questions.  Other tests or screenings that may be performed during your third trimester include:  Blood tests that check for low iron levels (anemia).  Fetal testing to check the health, activity level, and growth of the fetus. Testing is done if you have certain medical conditions or if there are problems during the pregnancy.  Nonstress test (NST). This test checks the health of your baby to make sure there are no signs of problems, such as the baby not getting enough oxygen. During this test, a belt is placed around your belly. The baby is made to move, and its heart rate is monitored during movement.  What is false labor? False labor is a condition in which you feel small, irregular tightenings of the muscles in the womb  (contractions) that usually go away with rest, changing position, or drinking water. These are called Braxton Hicks contractions. Contractions may last for hours, days, or even weeks before true labor sets in. If contractions come at regular intervals, become more frequent, increase in intensity, or become painful, you should see your health care provider. What are the signs of labor?  Abdominal cramps.  Regular contractions that start at 10 minutes apart and become stronger and more frequent with time.  Contractions that start on the top of the uterus and spread down to the lower abdomen and back.  Increased pelvic pressure and dull back pain.  A watery or bloody mucus discharge that comes from the vagina.  Leaking of amniotic fluid. This is also known as your "water breaking." It could be a slow trickle or a gush. Let your health care provider know if it has a color or strange odor. If you have any of these signs, call your health care provider right away, even if it is before your due date. Follow these instructions at home: Medicines  Follow your health care provider's instructions regarding medicine use. Specific medicines may be either safe or unsafe to take during pregnancy.  Take a prenatal vitamin that contains at least 600 micrograms (mcg) of folic acid.  If you develop constipation, try taking a stool softener if your health care provider approves. Eating and drinking  Eat a balanced diet that includes fresh fruits and vegetables, whole grains, good sources of protein such as meat, eggs, or tofu, and low-fat dairy. Your health care provider will help you determine the amount of weight gain that is right for you.  Avoid raw meat and uncooked cheese. These carry germs that can cause birth defects in the baby.  If you have low calcium intake from food, talk to your health care provider about whether you should take a daily calcium supplement.  Eat four or five small meals  rather than three large meals a day.  Limit foods that are high in fat and processed sugars, such as fried and sweet foods.  To prevent constipation: ? Drink enough fluid to keep your urine clear or pale yellow. ? Eat foods that are high in fiber, such as fresh fruits and vegetables, whole grains, and beans. Activity  Exercise only as directed by your health care provider. Most women can continue their usual exercise routine during pregnancy. Try to exercise for 30 minutes at least 5 days a week. Stop exercising if you experience uterine contractions.  Avoid heavy lifting.  Do not exercise in extreme heat or humidity, or at high altitudes.  Wear low-heel, comfortable shoes.  Practice good posture.  You may continue to have sex unless your health care provider tells you otherwise. Relieving pain and discomfort  Take frequent breaks and rest with your legs elevated if you have leg cramps or low back pain.  Take warm sitz baths to soothe any pain or discomfort caused by hemorrhoids. Use hemorrhoid cream if your health care provider approves.  Wear a good support bra to prevent discomfort from breast tenderness.  If you develop varicose veins: ? Wear support pantyhose or compression stockings as told by your healthcare provider. ? Elevate your feet for 15 minutes, 3-4 times a day. Prenatal care  Write down your questions. Take them to your prenatal visits.  Keep all your prenatal visits as told by your health care provider. This is important. Safety  Wear your seat belt at all times when driving.  Make a list of emergency phone numbers, including numbers for family, friends, the hospital, and police and fire departments. General instructions  Avoid cat litter boxes and soil used by cats. These carry germs that can cause birth defects in the baby. If you have a cat, ask someone to clean the litter box for you.  Do not travel far distances unless it is absolutely necessary and  only with the approval of your health care provider.  Do not use hot tubs, steam rooms, or saunas.  Do not drink alcohol.  Do not use any products that contain nicotine or tobacco, such as cigarettes and e-cigarettes. If you need help quitting, ask your health care provider.  Do not use any medicinal herbs or unprescribed drugs. These chemicals affect the formation and growth of the baby.  Do not douche or use tampons or scented sanitary pads.  Do not cross your legs for long periods of time.  To prepare for the arrival of your baby: ? Take prenatal classes to understand, practice, and ask questions about labor and delivery. ? Make a trial run to the hospital. ? Visit the hospital and tour the maternity area. ? Arrange for maternity or paternity leave through employers. ? Arrange for family and friends to take care of pets while you are in the hospital. ? Purchase a rear-facing car seat and make sure you know how to install it in your car. ? Pack your hospital bag. ? Prepare the baby's nursery. Make sure to remove all pillows and stuffed animals from the baby's crib to prevent suffocation.  Visit your dentist if you have not gone during your pregnancy. Use a soft toothbrush to brush your teeth and be gentle when you floss. Contact a health care provider if:  You are unsure if you are in labor or if your water has broken.  You become dizzy.  You have mild pelvic cramps, pelvic pressure, or nagging pain in your abdominal area.  You have lower back pain.  You have persistent nausea, vomiting, or diarrhea.  You have an unusual or bad smelling vaginal discharge.  You have pain when you urinate. Get help right away if:  Your water breaks before 37 weeks.  You have regular contractions less than 5 minutes apart before 37 weeks.  You have a fever.  You are leaking fluid from your vagina.  You have spotting or bleeding from your vagina.  You have severe abdominal pain or  cramping.  You have rapid weight loss or weight gain.  You  have shortness of breath with chest pain.  You notice sudden or extreme swelling of your face, hands, ankles, feet, or legs.  Your baby makes fewer than 10 movements in 2 hours.  You have severe headaches that do not go away when you take medicine.  You have vision changes. Summary  The third trimester is from week 28 through week 40, months 7 through 9. The third trimester is a time when the unborn baby (fetus) is growing rapidly.  During the third trimester, your discomfort may increase as you and your baby continue to gain weight. You may have abdominal, leg, and back pain, sleeping problems, and an increased need to urinate.  During the third trimester your breasts will keep growing and they will continue to become tender. A yellow fluid (colostrum) may leak from your breasts. This is the first milk you are producing for your baby.  False labor is a condition in which you feel small, irregular tightenings of the muscles in the womb (contractions) that eventually go away. These are called Braxton Hicks contractions. Contractions may last for hours, days, or even weeks before true labor sets in.  Signs of labor can include: abdominal cramps; regular contractions that start at 10 minutes apart and become stronger and more frequent with time; watery or bloody mucus discharge that comes from the vagina; increased pelvic pressure and dull back pain; and leaking of amniotic fluid. This information is not intended to replace advice given to you by your health care provider. Make sure you discuss any questions you have with your health care provider. Document Released: 08/16/2001 Document Revised: 01/28/2016 Document Reviewed: 10/23/2012 Elsevier Interactive Patient Education  2017 [employer].

## 2017-02-15 LAB — HEMOGLOBIN AND HEMATOCRIT, BLOOD
Hematocrit: 38.6 % (ref 34.0–46.6)
Hemoglobin: 13.2 g/dL (ref 11.1–15.9)

## 2017-02-15 LAB — GLUCOSE, 1 HOUR GESTATIONAL: Gestational Diabetes Screen: 96 mg/dL (ref 65–139)

## 2017-02-28 ENCOUNTER — Encounter: Payer: Self-pay | Admitting: Certified Nurse Midwife

## 2017-02-28 ENCOUNTER — Ambulatory Visit (INDEPENDENT_AMBULATORY_CARE_PROVIDER_SITE_OTHER): Payer: BLUE CROSS/BLUE SHIELD | Admitting: Certified Nurse Midwife

## 2017-02-28 VITALS — BP 121/75 | HR 95 | Wt 193.5 lb

## 2017-02-28 DIAGNOSIS — Z3493 Encounter for supervision of normal pregnancy, unspecified, third trimester: Secondary | ICD-10-CM

## 2017-02-28 LAB — POCT URINALYSIS DIPSTICK
BILIRUBIN UA: NEGATIVE
GLUCOSE UA: NEGATIVE
Ketones, UA: NEGATIVE
Nitrite, UA: NEGATIVE
Protein, UA: NEGATIVE
RBC UA: NEGATIVE
SPEC GRAV UA: 1.01 (ref 1.010–1.025)
Urobilinogen, UA: 0.2 E.U./dL
pH, UA: 7.5 (ref 5.0–8.0)

## 2017-02-28 NOTE — Progress Notes (Signed)
ROB, doing well. Complains of nausea for the past 2-3 days. She states that she had not had a fever, no diarrhea, and is able to tolerate food & liquids. Discussed possible stomach bug. Sample of bonjesta given with instructions for use. Red flag symptoms to notify of, reviewed. Endorses good fetal movement, denies contractoins. Follow up 2 wks.   Autumn BurkeAnnie Jaionna Weisse, CNM

## 2017-02-28 NOTE — Patient Instructions (Signed)

## 2017-03-16 ENCOUNTER — Encounter: Payer: Self-pay | Admitting: Certified Nurse Midwife

## 2017-03-16 ENCOUNTER — Ambulatory Visit (INDEPENDENT_AMBULATORY_CARE_PROVIDER_SITE_OTHER): Payer: Medicaid Other | Admitting: Certified Nurse Midwife

## 2017-03-16 VITALS — BP 118/75 | HR 102 | Wt 195.2 lb

## 2017-03-16 DIAGNOSIS — R8299 Other abnormal findings in urine: Secondary | ICD-10-CM

## 2017-03-16 DIAGNOSIS — R82998 Other abnormal findings in urine: Secondary | ICD-10-CM

## 2017-03-16 DIAGNOSIS — Z3493 Encounter for supervision of normal pregnancy, unspecified, third trimester: Secondary | ICD-10-CM

## 2017-03-16 LAB — POCT URINALYSIS DIPSTICK
BILIRUBIN UA: NEGATIVE
Glucose, UA: NEGATIVE
KETONES UA: NEGATIVE
Nitrite, UA: NEGATIVE
PH UA: 6 (ref 5.0–8.0)
RBC UA: NEGATIVE
Spec Grav, UA: 1.015 (ref 1.010–1.025)
Urobilinogen, UA: 0.2 E.U./dL

## 2017-03-16 NOTE — Patient Instructions (Addendum)
Carpal Tunnel Syndrome Carpal tunnel syndrome is a condition that causes pain in your hand and arm. The carpal tunnel is a narrow area located on the palm side of your wrist. Repeated wrist motion or certain diseases may cause swelling within the tunnel. This swelling pinches the main nerve in the wrist (median nerve). What are the causes? This condition may be caused by:  Repeated wrist motions.  Wrist injuries.  Arthritis.  A cyst or tumor in the carpal tunnel.  Fluid buildup during pregnancy.  Sometimes the cause of this condition is not known. What increases the risk? This condition is more likely to develop in:  People who have jobs that cause them to repeatedly move their wrists in the same motion, such as Health visitor.  Women.  People with certain conditions, such as: ? Diabetes. ? Obesity. ? An underactive thyroid (hypothyroidism). ? Kidney failure.  What are the signs or symptoms? Symptoms of this condition include:  A tingling feeling in your fingers, especially in your thumb, index, and middle fingers.  Tingling or numbness in your hand.  An aching feeling in your entire arm, especially when your wrist and elbow are bent for long periods of time.  Wrist pain that goes up your arm to your shoulder.  Pain that goes down into your palm or fingers.  A weak feeling in your hands. You may have trouble grabbing and holding items.  Your symptoms may feel worse during the night. How is this diagnosed? This condition is diagnosed with a medical history and physical exam. You may also have tests, including:  An electromyogram (EMG). This test measures electrical signals sent by your nerves into the muscles.  X-rays.  How is this treated? Treatment for this condition includes:  Lifestyle changes. It is important to stop doing or modify the activity that caused your condition.  Physical or occupational therapy.  Medicines for pain and inflammation.  This may include medicine that is injected into your wrist.  A wrist splint.  Surgery.  Follow these instructions at home: If you have a splint:  Wear it as told by your health care provider. Remove it only as told by your health care provider.  Loosen the splint if your fingers become numb and tingle, or if they turn cold and blue.  Keep the splint clean and dry. General instructions  Take over-the-counter and prescription medicines only as told by your health care provider.  Rest your wrist from any activity that may be causing your pain. If your condition is work related, talk to your employer about changes that can be made, such as getting a wrist pad to use while typing.  If directed, apply ice to the painful area: ? Put ice in a plastic bag. ? Place a towel between your skin and the bag. ? Leave the ice on for 20 minutes, 2-3 times per day.  Keep all follow-up visits as told by your health care provider. This is important.  Do any exercises as told by your health care provider, physical therapist, or occupational therapist. Contact a health care provider if:  You have new symptoms.  Your pain is not controlled with medicines.  Your symptoms get worse. This information is not intended to replace advice given to you by your health care provider. Make sure you discuss any questions you have with your health care provider. Document Released: 08/19/2000 Document Revised: 12/31/2015 Document Reviewed: 01/07/2015 Elsevier Interactive Patient Education  2017 [employer]. [place] Class  February 15, 2017  Wednesday 7:00p - 9:00p  [facility] [place]  March 22, 2017  Wednesday 7:00p - 9:00p [facility] [place]    April 26, 2017   Wednesday 7:00p - 9:000p [facility] [place]  May 24, 2017  Wednesday  7:00p - 9:00p [facility] [place]  June 21, 2017 Wednesday 7:00p - 9:00p [facility] [place]  Interested in a waterbirth?  This informational class will help you discover whether waterbirth is the right fit for you.  Education about waterbirth itself, supplies you would need and how to assemble your support team is what you can expect from this class.  Some obstetrical practices require this class in order to pursue a waterbirth.  (Not all obstetrical practices offer waterbirth check with your healthcare provider)  Register only the expectant mom, but you are encouraged to bring your partner to class!  Fees & Payment No fee  Register Online [email or web address] Search Waterbirth Braxton Hicks Contractions Contractions of the uterus can occur throughout pregnancy, but they are not always a sign that you are in labor. You may have practice contractions called Braxton Hicks contractions. These false labor contractions are sometimes confused with true labor. What are Deberah PeltonBraxton Hicks contractions? Braxton Hicks contractions are tightening movements that occur in the muscles of the uterus before labor. Unlike true labor contractions, these contractions do not result in opening (dilation) and thinning of the cervix. Toward the end of pregnancy (32-34 weeks), Braxton Hicks contractions can happen more often and may become stronger. These contractions are sometimes difficult to tell apart from true labor because they can be very uncomfortable. You should not feel embarrassed if you go to the hospital with false labor. Sometimes, the only way to tell if you are in true labor is for your health care provider to look for changes in the cervix. The health care provider will do a physical exam and may monitor your contractions. If you are not in true labor, the exam should show that your cervix is not dilating and your water has not broken. If there are no prenatal problems or other health problems associated with  your pregnancy, it is completely safe for you to be sent home with false labor. You may continue to have Braxton Hicks contractions until you go into true labor. How can I tell the difference between true labor and false labor?  Differences ? False labor ? Contractions last 30-70 seconds.: Contractions are usually shorter and not as strong as true labor contractions. ? Contractions become very regular.: Contractions are usually irregular. ? Discomfort is usually felt in the top of the uterus, and it spreads to the lower abdomen and low back.: Contractions are often felt in the front of the lower abdomen and in the groin. ? Contractions do not go away with walking.: Contractions may go away when you walk around or change positions while lying down. ? Contractions usually become more intense and increase in frequency.: Contractions get weaker and are shorter-lasting as time goes on. ? The cervix dilates and gets thinner.: The cervix usually does not dilate or become thin. Follow these instructions at home:  Take over-the-counter and prescription medicines only as told by your health care provider.  Keep up with your usual exercises and follow other instructions from your health care provider.  Eat and drink lightly if you think you are going into labor.  If CSX CorporationBraxton Hicks contractions  are making you uncomfortable: ? Change your position from lying down or resting to walking, or change from walking to resting. ? Sit and rest in a tub of warm water. ? Drink enough fluid to keep your urine clear or pale yellow. Dehydration may cause these contractions. ? Do slow and deep breathing several times an hour.  Keep all follow-up prenatal visits as told by your health care provider. This is important. Contact a health care provider if:  You have a fever.  You have continuous pain in your abdomen. Get help right away if:  Your contractions become stronger, more regular, and closer together.  You  have fluid leaking or gushing from your vagina.  You pass blood-tinged mucus (bloody show).  You have bleeding from your vagina.  You have low back pain that you never had before.  You feel your baby's head pushing down and causing pelvic pressure.  Your baby is not moving inside you as much as it used to. Summary  Contractions that occur before labor are called Braxton Hicks contractions, false labor, or practice contractions.  Braxton Hicks contractions are usually shorter, weaker, farther apart, and less regular than true labor contractions. True labor contractions usually become progressively stronger and regular and they become more frequent.  Manage discomfort from [place] contractions by changing position, resting in a warm bath, drinking plenty of water, or practicing deep breathing. This information is not intended to replace advice given to you by your health care provider. Make sure you discuss any questions you have with your health care provider. Document Released: 08/22/2005 Document Revised: 07/11/2016 Document Reviewed: 07/11/2016 Elsevier Interactive Patient Education  2017 [employer].

## 2017-03-16 NOTE — Progress Notes (Signed)
ROB-Pt doing well. [facility] scheduled for 08/04. Education regarding home treatment measures. Plans breastfeeding, natural labor, skin to skin, and delayed cord clamping. [place] [facility] class schedule given. [facility] class are full; will contact Merita NortonEmma Burress, local doula, about private childbirth classes. Anticipatory guidance regarding 36 Reviewed red flag symptoms and when to call. RTXC x 2-3 weeks for 36 week cultures and ROB.

## 2017-03-18 LAB — CULTURE, OB URINE

## 2017-03-18 LAB — URINE CULTURE, OB REFLEX

## 2017-03-31 ENCOUNTER — Encounter: Payer: Self-pay | Admitting: Certified Nurse Midwife

## 2017-04-06 ENCOUNTER — Ambulatory Visit (INDEPENDENT_AMBULATORY_CARE_PROVIDER_SITE_OTHER): Payer: Medicaid Other | Admitting: Certified Nurse Midwife

## 2017-04-06 ENCOUNTER — Encounter: Payer: Self-pay | Admitting: Certified Nurse Midwife

## 2017-04-06 VITALS — BP 123/83 | HR 87 | Wt 201.4 lb

## 2017-04-06 DIAGNOSIS — Z369 Encounter for antenatal screening, unspecified: Secondary | ICD-10-CM

## 2017-04-06 DIAGNOSIS — Z113 Encounter for screening for infections with a predominantly sexual mode of transmission: Secondary | ICD-10-CM

## 2017-04-06 DIAGNOSIS — Z3493 Encounter for supervision of normal pregnancy, unspecified, third trimester: Secondary | ICD-10-CM

## 2017-04-06 LAB — POCT URINALYSIS DIPSTICK
BILIRUBIN UA: NEGATIVE
Blood, UA: NEGATIVE
Glucose, UA: NEGATIVE
KETONES UA: NEGATIVE
Nitrite, UA: NEGATIVE
PH UA: 7 (ref 5.0–8.0)
PROTEIN UA: NEGATIVE
Spec Grav, UA: 1.01 (ref 1.010–1.025)
Urobilinogen, UA: 0.2 E.U./dL

## 2017-04-06 NOTE — Patient Instructions (Signed)
Vaginal Delivery Vaginal delivery means that you will give birth by pushing your baby out of your birth canal (vagina). A team of health care providers will help you before, during, and after vaginal delivery. Birth experiences are unique for every woman and every pregnancy, and birth experiences vary depending on where you choose to give birth. What should I do to prepare for my baby's birth? Before your baby is born, it is important to talk with your health care provider about:  Your labor and delivery preferences. These may include: ? Medicines that you may be given. ? How you will manage your pain. This might include non-medical pain relief techniques or injectable pain relief such as epidural analgesia. ? How you and your baby will be monitored during labor and delivery. ? Who may be in the labor and delivery room with you. ? Your feelings about surgical delivery of your baby (cesarean delivery, or C-section) if this becomes necessary. ? Your feelings about receiving donated blood through an IV tube (blood transfusion) if this becomes necessary.  Whether you are able: ? To take pictures or videos of the birth. ? To eat during labor and delivery. ? To move around, walk, or change positions during labor and delivery.  What to expect after your baby is born, such as: ? Whether delayed umbilical cord clamping and cutting is offered. ? Who will care for your baby right after birth. ? Medicines or tests that may be recommended for your baby. ? Whether breastfeeding is supported in your hospital or birth center. ? How long you will be in the hospital or birth center.  How any medical conditions you have may affect your baby or your labor and delivery experience.  To prepare for your baby's birth, you should also:  Attend all of your health care visits before delivery (prenatal visits) as recommended by your health care provider. This is important.  Prepare your home for your baby's  arrival. Make sure that you have: ? Diapers. ? Baby clothing. ? Feeding equipment. ? Safe sleeping arrangements for you and your baby.  Install a car seat in your vehicle. Have your car seat checked by a certified car seat installer to make sure that it is installed safely.  Think about who will help you with your new baby at home for at least the first several weeks after delivery.  What can I expect when I arrive at the birth center or hospital? Once you are in labor and have been admitted into the hospital or birth center, your health care provider may:  Review your pregnancy history and any concerns you have.  Insert an IV tube into one of your veins. This is used to give you fluids and medicines.  Check your blood pressure, pulse, temperature, and heart rate (vital signs).  Check whether your bag of water (amniotic sac) has broken (ruptured).  Talk with you about your birth plan and discuss pain control options.  Monitoring Your health care provider may monitor your contractions (uterine monitoring) and your baby's heart rate (fetal monitoring). You may need to be monitored:  Often, but not continuously (intermittently).  All the time or for long periods at a time (continuously). Continuous monitoring may be needed if: ? You are taking certain medicines, such as medicine to relieve pain or make your contractions stronger. ? You have pregnancy or labor complications.  Monitoring may be done by:  Placing a special stethoscope or a handheld monitoring device on your abdomen to   check your baby's heartbeat, and feeling your abdomen for contractions. This method of monitoring does not continuously record your baby's heartbeat or your contractions.  Placing monitors on your abdomen (external monitors) to record your baby's heartbeat and the frequency and length of contractions. You may not have to wear external monitors all the time.  Placing monitors inside of your uterus  (internal monitors) to record your baby's heartbeat and the frequency, length, and strength of your contractions. ? Your health care provider may use internal monitors if he or she needs more information about the strength of your contractions or your baby's heart rate. ? Internal monitors are put in place by passing a thin, flexible wire through your vagina and into your uterus. Depending on the type of monitor, it may remain in your uterus or on your baby's head until birth. ? Your health care provider will discuss the benefits and risks of internal monitoring with you and will ask for your permission before inserting the monitors.  Telemetry. This is a type of continuous monitoring that can be done with external or internal monitors. Instead of having to stay in bed, you are able to move around during telemetry. Ask your health care provider if telemetry is an option for you.  Physical exam Your health care provider may perform a physical exam. This may include:  Checking whether your baby is positioned: ? With the head toward your vagina (head-down). This is most common. ? With the head toward the top of your uterus (head-up or breech). If your baby is in a breech position, your health care provider may try to turn your baby to a head-down position so you can deliver vaginally. If it does not seem that your baby can be born vaginally, your provider may recommend surgery to deliver your baby. In rare cases, you may be able to deliver vaginally if your baby is head-up (breech delivery). ? Lying sideways (transverse). Babies that are lying sideways cannot be delivered vaginally.  Checking your cervix to determine: ? Whether it is thinning out (effacing). ? Whether it is opening up (dilating). ? How low your baby has moved into your birth canal.  What are the three stages of labor and delivery?  Normal labor and delivery is divided into the following three stages: Stage 1  Stage 1 is the  longest stage of labor, and it can last for hours or days. Stage 1 includes: ? Early labor. This is when contractions may be irregular, or regular and mild. Generally, early labor contractions are more than 10 minutes apart. ? Active labor. This is when contractions get longer, more regular, more frequent, and more intense. ? The transition phase. This is when contractions happen very close together, are very intense, and may last longer than during any other part of labor.  Contractions generally feel mild, infrequent, and irregular at first. They get stronger, more frequent (about every 2-3 minutes), and more regular as you progress from early labor through active labor and transition.  Many women progress through stage 1 naturally, but you may need help to continue making progress. If this happens, your health care provider may talk with you about: ? Rupturing your amniotic sac if it has not ruptured yet. ? Giving you medicine to help make your contractions stronger and more frequent.  Stage 1 ends when your cervix is completely dilated to 4 inches (10 cm) and completely effaced. This happens at the end of the transition phase. Stage 2  Once   your cervix is completely effaced and dilated to 4 inches (10 cm), you may start to feel an urge to push. It is common for the body to naturally take a rest before feeling the urge to push, especially if you received an epidural or certain other pain medicines. This rest period may last for up to 1-2 hours, depending on your unique labor experience.  During stage 2, contractions are generally less painful, because pushing helps relieve contraction pain. Instead of contraction pain, you may feel stretching and burning pain, especially when the widest part of your baby's head passes through the vaginal opening (crowning).  Your health care provider will closely monitor your pushing progress and your baby's progress through the vagina during stage 2.  Your  health care provider may massage the area of skin between your vaginal opening and anus (perineum) or apply warm compresses to your perineum. This helps it stretch as the baby's head starts to crown, which can help prevent perineal tearing. ? In some cases, an incision may be made in your perineum (episiotomy) to allow the baby to pass through the vaginal opening. An episiotomy helps to make the opening of the vagina larger to allow more room for the baby to fit through.  It is very important to breathe and focus so your health care provider can control the delivery of your baby's head. Your health care provider may have you decrease the intensity of your pushing, to help prevent perineal tearing.  After delivery of your baby's head, the shoulders and the rest of the body generally deliver very quickly and without difficulty.  Once your baby is delivered, the umbilical cord may be cut right away, or this may be delayed for 1-2 minutes, depending on your baby's health. This may vary among health care providers, hospitals, and birth centers.  If you and your baby are healthy enough, your baby may be placed on your chest or abdomen to help maintain the baby's temperature and to help you bond with each other. Some mothers and babies start breastfeeding at this time. Your health care team will dry your baby and help keep your baby warm during this time.  Your baby may need immediate care if he or she: ? Showed signs of distress during labor. ? Has a medical condition. ? Was born too early (prematurely). ? Had a bowel movement before birth (meconium). ? Shows signs of difficulty transitioning from being inside the uterus to being outside of the uterus. If you are planning to breastfeed, your health care team will help you begin a feeding. Stage 3  The third stage of labor starts immediately after the birth of your baby and ends after you deliver the placenta. The placenta is an organ that develops  during pregnancy to provide oxygen and nutrients to your baby in the womb.  Delivering the placenta may require some pushing, and you may have mild contractions. Breastfeeding can stimulate contractions to help you deliver the placenta.  After the placenta is delivered, your uterus should tighten (contract) and become firm. This helps to stop bleeding in your uterus. To help your uterus contract and to control bleeding, your health care provider may: ? Give you medicine by injection, through an IV tube, by mouth, or through your rectum (rectally). ? Massage your abdomen or perform a vaginal exam to remove any blood clots that are left in your uterus. ? Empty your bladder by placing a thin, flexible tube (catheter) into your bladder. ? Encourage   you to breastfeed your baby. After labor is over, you and your baby will be monitored closely to ensure that you are both healthy until you are ready to go home. Your health care team will teach you how to care for yourself and your baby. This information is not intended to replace advice given to you by your health care provider. Make sure you discuss any questions you have with your health care provider. Document Released: 05/31/2008 Document Revised: 03/11/2016 Document Reviewed: 09/06/2015 Elsevier Interactive Patient Education  2018 [employer]  

## 2017-04-06 NOTE — Progress Notes (Signed)
ROB-Pt doing well, here for 36 week cultures. Yesterday was last day of work. Carpal tunnel improving. Business card given for Merita NortonEmma Burress, Doula & Childbirth Educator. Reviewed red flag symptoms and when to call. RTC x 1 weeks for ROB or sooner if needed.

## 2017-04-08 LAB — GC/CHLAMYDIA PROBE AMP
CHLAMYDIA, DNA PROBE: NEGATIVE
NEISSERIA GONORRHOEAE BY PCR: NEGATIVE

## 2017-04-08 LAB — STREP GP B NAA: Strep Gp B NAA: NEGATIVE

## 2017-04-10 ENCOUNTER — Encounter: Payer: Self-pay | Admitting: *Deleted

## 2017-04-10 DIAGNOSIS — Z3A37 37 weeks gestation of pregnancy: Secondary | ICD-10-CM | POA: Insufficient documentation

## 2017-04-10 DIAGNOSIS — Z87891 Personal history of nicotine dependence: Secondary | ICD-10-CM | POA: Diagnosis not present

## 2017-04-10 DIAGNOSIS — O9989 Other specified diseases and conditions complicating pregnancy, childbirth and the puerperium: Secondary | ICD-10-CM | POA: Diagnosis not present

## 2017-04-10 DIAGNOSIS — Z79899 Other long term (current) drug therapy: Secondary | ICD-10-CM | POA: Insufficient documentation

## 2017-04-10 DIAGNOSIS — R0789 Other chest pain: Secondary | ICD-10-CM | POA: Diagnosis not present

## 2017-04-10 DIAGNOSIS — R079 Chest pain, unspecified: Secondary | ICD-10-CM | POA: Diagnosis present

## 2017-04-10 LAB — BASIC METABOLIC PANEL
ANION GAP: 8 (ref 5–15)
BUN: 11 mg/dL (ref 6–20)
CHLORIDE: 105 mmol/L (ref 101–111)
CO2: 26 mmol/L (ref 22–32)
Calcium: 8.8 mg/dL — ABNORMAL LOW (ref 8.9–10.3)
Creatinine, Ser: 0.63 mg/dL (ref 0.44–1.00)
GFR calc non Af Amer: >60 mL/min (ref >60)
Glucose, Bld: 106 mg/dL — ABNORMAL HIGH (ref 65–99)
POTASSIUM: 4 mmol/L (ref 3.5–5.1)
SODIUM: 139 mmol/L (ref 135–145)

## 2017-04-10 LAB — CBC
HEMATOCRIT: 36.1 % (ref 35.0–47.0)
Hemoglobin: 12.6 g/dL (ref 12.0–16.0)
MCH: 30.8 pg (ref 26.0–34.0)
MCHC: 34.9 g/dL (ref 32.0–36.0)
MCV: 88.2 fL (ref 80.0–100.0)
Platelets: 262 10*3/uL (ref 150–440)
RBC: 4.09 MIL/uL (ref 3.80–5.20)
RDW: 12.7 % (ref 11.5–14.5)
WBC: 7.6 10*3/uL (ref 3.6–11.0)

## 2017-04-10 LAB — TROPONIN I: Troponin I: <0.03 ng/mL (ref <0.03)

## 2017-04-10 NOTE — ED Triage Notes (Signed)
Pt ambulatory to triage  Pt is [redacted] weeks pregnant.  Pt has left side chest pain for 1 day.  No sob.  Pt has n/v.  Pt denies abd pain.  No vag bleeding .  Pt alert.

## 2017-04-11 ENCOUNTER — Emergency Department: Payer: Medicaid Other

## 2017-04-11 ENCOUNTER — Emergency Department
Admission: EM | Admit: 2017-04-11 | Discharge: 2017-04-11 | Disposition: A | Discharge status: Home / Self Care | Payer: Medicaid Other | Attending: Emergency Medicine | Admitting: Emergency Medicine

## 2017-04-11 ENCOUNTER — Encounter: Payer: Self-pay | Admitting: Radiology

## 2017-04-11 DIAGNOSIS — R0789 Other chest pain: Secondary | ICD-10-CM

## 2017-04-11 MED ORDER — FAMOTIDINE 20 MG PO TABS
40.0000 mg | ORAL_TABLET | Freq: Once | ORAL | Status: AC
  Administered 2017-04-11: 40 mg via ORAL
  Filled 2017-04-11: qty 2

## 2017-04-11 MED ORDER — GI COCKTAIL ~~LOC~~
30.0000 mL | Freq: Once | ORAL | Status: AC
  Administered 2017-04-11: 30 mL via ORAL
  Filled 2017-04-11: qty 30

## 2017-04-11 MED ORDER — IOPAMIDOL (ISOVUE-370) INJECTION 76%
75.0000 mL | Freq: Once | INTRAVENOUS | Status: AC | PRN
  Administered 2017-04-11: 75 mL via INTRAVENOUS

## 2017-04-11 NOTE — ED Provider Notes (Signed)
[facility] Emergency Department Provider Note  ____________________________________________   First MD Initiated Contact with Patient 04/11/17 0222     (approximate)  I have reviewed the triage vital signs and the nursing notes.   HISTORY  Chief Complaint Chest Pain    HPI Autumn Williams is a 21 y.o. female who comes to the emergency Department with roughly 5 hours of sharp upper chest discomfort. She was taking a nap and the pain woke her from her sleep. The pain is been constant ever since. It is nonexertional. She has shortness of breath. The pain seems to be worse with sitting up and improved when lying flat. She does have a foul taste in her mouth. She is currently [redacted] weeks pregnant. She's never had a deep vein thrombus or pulmonary embolism before. She denies leg swelling.   Past Medical History:  Diagnosis Date  . Amenorrhea     Patient Active Problem List   Diagnosis Date Noted  . Fetal echogenic intracardiac focus on prenatal ultrasound 12/18/2016  . Amenorrhea     Past Surgical History:  Procedure Laterality Date  . none      Prior to Admission medications   Medication Sig Start Date End Date Taking? Authorizing Provider  Prenatal Vit-DSS-Fe Cbn-FA (PRENA-CAP PO) Take by mouth.    [provider]    Allergies Patient has no known allergies.  Family History  Problem Relation Age of Onset  . Cancer Neg Hx   . Diabetes Neg Hx   . Heart failure Neg Hx     Social History Social History  Substance Use Topics  . Smoking status: Former Smoker    Types: Cigarettes    Quit date: 08/2016  . Smokeless tobacco: Never Used  . Alcohol use Yes     Comment: occ    Review of Systems Constitutional: No fever/chills Eyes: No visual changes. ENT: No sore throat. Cardiovascular: Positive chest pain. Respiratory: Denies shortness of breath. Gastrointestinal: No abdominal pain.  No nausea, no vomiting.  No diarrhea.  No  constipation. Genitourinary: Negative for dysuria. Musculoskeletal: Negative for back pain. Skin: Negative for rash. Neurological: Negative for headaches, focal weakness or numbness.   ____________________________________________   PHYSICAL EXAM:  VITAL SIGNS: ED Triage Vitals  Enc Vitals Group     BP 04/10/17 2315 136/79     Pulse Rate 04/10/17 2315 97     Resp 04/10/17 2315 20     Temp 04/10/17 2315 98.6 F (37 C)     Temp Source 04/10/17 2315 Oral     SpO2 04/10/17 2315 99 %     Weight --      Height --      Head Circumference --      Peak Flow --      Pain Score 04/10/17 2312 8     Pain Loc --      Pain Edu? --      Excl. in GC? --     Constitutional: Alert and oriented 4 pleasant cooperative speaks in full clear sentences no diaphoresis Eyes: PERRL EOMI. Head: Atraumatic. Nose: No congestion/rhinnorhea. Mouth/Throat: No trismus Neck: No stridor.   Cardiovascular: Normal rate, regular rhythm. Grossly normal heart sounds.  Good peripheral circulation. Respiratory: Normal respiratory effort.  No retractions. Lungs CTAB and moving good air Gastrointestinal: Gravid abdomen soft nontender Musculoskeletal: No lower extremity edema   Neurologic:  Normal speech and language. No gross focal neurologic deficits are appreciated. Skin:  Skin is warm, dry  and intact. No rash noted. Psychiatric: Mood and affect are normal. Speech and behavior are normal.    ____________________________________________   DIFFERENTIAL includes but not limited to  Pulmonary embolism, pneumonia, pneumothorax, gastric reflux ____________________________________________   LABS (all labs ordered are listed, but only abnormal results are displayed)  Labs Reviewed  BASIC METABOLIC PANEL - Abnormal; Notable for the following:       Result Value   Glucose, Bld 106 (*)    Calcium 8.8 (*)    All other components within normal limits  CBC  TROPONIN I    No signs of acute  ischemia __________________________________________  EKG  ED ECG REPORT I, Merrily Brittle, the attending physician, personally viewed and interpreted this ECG.  Date: 04/11/2017 Rate: 96 Rhythm: normal sinus rhythm QRS Axis: Rightward axis Intervals: normal ST/T Wave abnormalities: normal Narrative Interpretation: Rightward axis with S1 every 3 T3 pattern. On review her previous EKG in January 2013 had a rightward axis but no Q or T-wave inversion in lead 3  ____________________________________________  RADIOLOGY  CT scan with no acute disease ____________________________________________   PROCEDURES  Procedure(s) performed: no  Procedures  Critical Care performed: no  Observation: no ____________________________________________   INITIAL IMPRESSION / ASSESSMENT AND PLAN / ED COURSE  Pertinent labs & imaging results that were available during my care of the patient were reviewed by me and considered in my medical decision making (see chart for details).  The differential in a 21 year old with chest pain while pregnant is broad but most concerning would be pulmonary embolism. The patient has no shortness of breath or pain is not pleuritic and she's had no leg swelling which makes her chest pain somewhat atypical. D-dimer would not be helpful as she is pregnant. We'll start with ultrasounds of her lower extremities as well as a trial of a GI cocktail and then reevaluate.    ----------------------------------------- 3:38 AM on 04/11/2017 -----------------------------------------  The patient's ultrasound of her legs came back negative for deep vein thrombus. I had a lengthy discussion with the patient regarding the risks and benefits of CT scan of the chest and as her symptoms are not improved whatsoever with the GI cocktail and as she has new right heart strain evident on EKG I think the benefits outweigh the risks and she requires a CT scan to evaluate for PE  today.  ____________________________________________ Fortunately the patient's CT scan is negative. At this point she is medically stable for discharge with return to her [facility] gynecologist this week as scheduled.  FINAL CLINICAL IMPRESSION(S) / ED DIAGNOSES  Final diagnoses:  Atypical chest pain      NEW MEDICATIONS STARTED DURING THIS VISIT:  Discharge Medication List as of 04/11/2017  4:52 AM       Note:  This document was prepared using Dragon voice recognition software and may include unintentional dictation errors.     Merrily Brittle, MD 04/11/17 [phone]

## 2017-04-11 NOTE — Discharge Instructions (Signed)
Fortunately today your blood work and your CT scan were reassuring. Please keep your appointment to see your OB gynecologist this coming Wednesday as scheduled and return to the emergency department for any concerns.  It was a pleasure to take care of you today, and thank you for coming to our emergency department.  If you have any questions or concerns before leaving please ask the nurse to grab me and I'm more than happy to go through your aftercare instructions again.  If you were prescribed any opioid pain medication today such as Norco, Vicodin, Percocet, morphine, hydrocodone, or oxycodone please make sure you do not drive when you are taking this medication as it can alter your ability to drive safely.  If you have any concerns once you are home that you are not improving or are in fact getting worse before you can make it to your follow-up appointment, please do not hesitate to call 911 and come back for further evaluation.  Merrily BrittleNeil Liliana Dang, MD  Results for orders placed or performed during the hospital encounter of 04/11/17  Basic metabolic panel  Result Value Ref Range   Sodium 139 135 - 145 mmol/L   Potassium 4.0 3.5 - 5.1 mmol/L   Chloride 105 101 - 111 mmol/L   CO2 26 22 - 32 mmol/L   Glucose, Bld 106 (H) 65 - 99 mg/dL   BUN 11 6 - 20 mg/dL   Creatinine, Ser 1.610.63 0.44 - 1.00 mg/dL   Calcium 8.8 (L) 8.9 - 10.3 mg/dL   GFR calc non Af Amer >60 >60 mL/min   GFR calc Af Amer >60 >60 mL/min   Anion gap 8 5 - 15  CBC  Result Value Ref Range   WBC 7.6 3.6 - 11.0 K/uL   RBC 4.09 3.80 - 5.20 MIL/uL   Hemoglobin 12.6 12.0 - 16.0 g/dL   HCT 09.636.1 04.535.0 - 40.947.0 %   MCV 88.2 80.0 - 100.0 fL   MCH 30.8 26.0 - 34.0 pg   MCHC 34.9 32.0 - 36.0 g/dL   RDW 81.112.7 91.411.5 - 78.214.5 %   Platelets 262 150 - 440 K/uL  Troponin I  Result Value Ref Range   Troponin I <0.03 <0.03 ng/mL   Ct Angio Chest Pe W/cm &/or Wo Cm  Result Date: 04/11/2017 CLINICAL DATA:  Acute onset of shortness of breath.  Initial encounter. EXAM: CT ANGIOGRAPHY CHEST WITH CONTRAST TECHNIQUE: Multidetector CT imaging of the chest was performed using the standard protocol during bolus administration of intravenous contrast. Multiplanar CT image reconstructions and MIPs were obtained to evaluate the vascular anatomy. CONTRAST:  75 mL of Isovue 370 IV contrast COMPARISON:  None. FINDINGS: Cardiovascular:  There is no evidence of pulmonary embolus. The heart is normal in size. The thoracic aorta is unremarkable in appearance. The great vessels are within normal limits. Mediastinum/Nodes: The mediastinum is unremarkable in appearance. No mediastinal lymphadenopathy is seen. No pericardial effusion is identified. The visualized portions of the thyroid gland are unremarkable in appearance. No axillary lymphadenopathy is appreciated. Lungs/Pleura: The lungs are clear bilaterally. No focal consolidation, pleural effusion or pneumothorax is seen. No masses are identified. Upper Abdomen: The visualized portions of the liver and spleen are grossly unremarkable. Musculoskeletal: No acute osseous abnormalities are identified. The visualized musculature is unremarkable in appearance. Review of the MIP images confirms the above findings. IMPRESSION: No evidence of pulmonary embolus.  Lungs clear bilaterally. Electronically Signed   By: Roanna RaiderJeffery  Chang M.D.   On: 04/11/2017 04:42  US Venous Img Lower Bilateral  Result Date: 04/11/2017 CLINICAL DATA:  Initial evaluation for acute leg swelling, concern for DVT. EXAM: BILATERAL LOWER EXTREMITY VENOUS DOPPLER ULTRASOUND TECHNIQUE: Gray-scale sonography with graded compression, as well as color Doppler and duplex ultrasound were performed to evaluate the lower extremity deep venous systems from the level of the common femoral vein and including the common femoral, femoral, profunda femoral, popliteal and calf veins including the posterior tibial, peroneal and gastrocnemius veins when visible. The  superficial great saphenous vein was also interrogated. Spectral Doppler was utilized to evaluate flow at rest and with distal augmentation maneuvers in the common femoral, femoral and popliteal veins. COMPARISON:  None. FINDINGS: RIGHT LOWER EXTREMITY Common Femoral Vein: No evidence of thrombus. Normal compressibility, respiratory phasicity and response to augmentation. Saphenofemoral Junction: No evidence of thrombus. Normal compressibility and flow on color Doppler imaging. Profunda Femoral Vein: No evidence of thrombus. Normal compressibility and flow on color Doppler imaging. Femoral Vein: No evidence of thrombus. Normal compressibility, respiratory phasicity and response to augmentation. Popliteal Vein: No evidence of thrombus. Normal compressibility, respiratory phasicity and response to augmentation. Calf Veins: No evidence of thrombus. Normal compressibility and flow on color Doppler imaging. Superficial Great Saphenous Vein: No evidence of thrombus. Normal compressibility and flow on color Doppler imaging. Venous Reflux:  None. Other Findings:  None. LEFT LOWER EXTREMITY Common Femoral Vein: No evidence of thrombus. Normal compressibility, respiratory phasicity and response to augmentation. Saphenofemoral Junction: No evidence of thrombus. Normal compressibility and flow on color Doppler imaging. Profunda Femoral Vein: No evidence of thrombus. Normal compressibility and flow on color Doppler imaging. Femoral Vein: No evidence of thrombus. Normal compressibility, respiratory phasicity and response to augmentation. Popliteal Vein: No evidence of thrombus. Normal compressibility, respiratory phasicity and response to augmentation. Calf Veins: No evidence of thrombus. Normal compressibility and flow on color Doppler imaging. Superficial Great Saphenous Vein: No evidence of thrombus. Normal compressibility and flow on color Doppler imaging. Venous Reflux:  None. Other Findings:  None. IMPRESSION: No evidence  of DVT within either lower extremity. Electronically Signed   By: Rise Mu M.D.   On: 04/11/2017 03:33

## 2017-04-12 ENCOUNTER — Ambulatory Visit (INDEPENDENT_AMBULATORY_CARE_PROVIDER_SITE_OTHER): Payer: Medicaid Other | Admitting: Certified Nurse Midwife

## 2017-04-12 ENCOUNTER — Encounter: Payer: Self-pay | Admitting: Certified Nurse Midwife

## 2017-04-12 VITALS — BP 115/84 | HR 88 | Wt 199.6 lb

## 2017-04-12 DIAGNOSIS — Z3493 Encounter for supervision of normal pregnancy, unspecified, third trimester: Secondary | ICD-10-CM

## 2017-04-12 LAB — POCT URINALYSIS DIPSTICK
Bilirubin, UA: NEGATIVE
Blood, UA: NEGATIVE
GLUCOSE UA: NEGATIVE
KETONES UA: NEGATIVE
Leukocytes, UA: NEGATIVE
Nitrite, UA: NEGATIVE
SPEC GRAV UA: 1.025 (ref 1.010–1.025)
Urobilinogen, UA: 0.2 E.U./dL
pH, UA: 7.5 (ref 5.0–8.0)

## 2017-04-12 NOTE — Patient Instructions (Signed)

## 2017-04-12 NOTE — Progress Notes (Signed)
ROB, doing well. No complaints or concerns. Reviewed labor precautions and fetal movement. Follow up 1 wk. With Melody.  Doreene BurkeAnnie Jalaina Salyers, CNM

## 2017-04-13 ENCOUNTER — Encounter: Payer: Medicaid Other | Admitting: Certified Nurse Midwife

## 2017-04-21 ENCOUNTER — Ambulatory Visit (INDEPENDENT_AMBULATORY_CARE_PROVIDER_SITE_OTHER): Payer: Medicaid Other | Admitting: Obstetrics and Gynecology

## 2017-04-21 VITALS — BP 113/84 | HR 102 | Wt 199.1 lb

## 2017-04-21 DIAGNOSIS — Z3493 Encounter for supervision of normal pregnancy, unspecified, third trimester: Secondary | ICD-10-CM

## 2017-04-21 LAB — POCT URINALYSIS DIPSTICK
BILIRUBIN UA: NEGATIVE
GLUCOSE UA: NEGATIVE
KETONES UA: NEGATIVE
Leukocytes, UA: NEGATIVE
Nitrite, UA: NEGATIVE
Protein, UA: NEGATIVE
SPEC GRAV UA: 1.015 (ref 1.010–1.025)
UROBILINOGEN UA: 0.2 U/dL
pH, UA: 7 (ref 5.0–8.0)

## 2017-04-21 NOTE — Progress Notes (Signed)
ROB- pt is having a lot of pelvic pressure and lower abd cramping

## 2017-04-21 NOTE — Progress Notes (Signed)
ROB- labor precautions discussed 

## 2017-04-22 ENCOUNTER — Inpatient Hospital Stay
Admission: EM | Admit: 2017-04-22 | Discharge: 2017-04-24 | DRG: 775 | Disposition: A | Discharge status: Home / Self Care | Payer: Medicaid Other | Attending: Obstetrics and Gynecology | Admitting: Obstetrics and Gynecology

## 2017-04-22 ENCOUNTER — Inpatient Hospital Stay: Payer: Medicaid Other | Admitting: Anesthesiology

## 2017-04-22 DIAGNOSIS — Z3A38 38 weeks gestation of pregnancy: Secondary | ICD-10-CM

## 2017-04-22 DIAGNOSIS — Z87891 Personal history of nicotine dependence: Secondary | ICD-10-CM | POA: Diagnosis not present

## 2017-04-22 DIAGNOSIS — Z3493 Encounter for supervision of normal pregnancy, unspecified, third trimester: Secondary | ICD-10-CM | POA: Diagnosis present

## 2017-04-22 LAB — CBC
HEMATOCRIT: 36 % (ref 35.0–47.0)
Hemoglobin: 12.8 g/dL (ref 12.0–16.0)
MCH: 31.5 pg (ref 26.0–34.0)
MCHC: 35.7 g/dL (ref 32.0–36.0)
MCV: 88.3 fL (ref 80.0–100.0)
Platelets: 249 10*3/uL (ref 150–440)
RBC: 4.08 MIL/uL (ref 3.80–5.20)
RDW: 13.6 % (ref 11.5–14.5)
WBC: 8.6 10*3/uL (ref 3.6–11.0)

## 2017-04-22 LAB — TYPE AND SCREEN
ABO/RH(D): O POS
ANTIBODY SCREEN: NEGATIVE

## 2017-04-22 MED ORDER — OXYTOCIN 40 UNITS IN LACTATED RINGERS INFUSION - SIMPLE MED
2.5000 [IU]/h | INTRAVENOUS | Status: DC
  Filled 2017-04-22: qty 1000

## 2017-04-22 MED ORDER — SODIUM CHLORIDE 0.9% FLUSH: 3.0000 mL | Freq: Two times a day (BID) | INTRAVENOUS | Status: DC

## 2017-04-22 MED ORDER — MISOPROSTOL 200 MCG PO TABS
ORAL_TABLET | ORAL | Status: AC
  Administered 2017-04-23: 800 ug via RECTAL
  Filled 2017-04-22: qty 4

## 2017-04-22 MED ORDER — OXYCODONE-ACETAMINOPHEN 5-325 MG PO TABS: 1.0000 | ORAL_TABLET | ORAL | Status: DC | PRN

## 2017-04-22 MED ORDER — OXYTOCIN BOLUS FROM INFUSION
500.0000 mL | Freq: Once | INTRAVENOUS | Status: AC
  Administered 2017-04-23: 500 mL via INTRAVENOUS

## 2017-04-22 MED ORDER — OXYTOCIN 10 UNIT/ML IJ SOLN
INTRAMUSCULAR | Status: AC
  Filled 2017-04-22: qty 2

## 2017-04-22 MED ORDER — SODIUM CHLORIDE 0.9 % IV SOLN: 250.0000 mL | INTRAVENOUS | Status: DC | PRN

## 2017-04-22 MED ORDER — ACETAMINOPHEN 325 MG PO TABS: 650.0000 mg | ORAL_TABLET | ORAL | Status: DC | PRN

## 2017-04-22 MED ORDER — SODIUM CHLORIDE 0.9% FLUSH: 3.0000 mL | INTRAVENOUS | Status: DC | PRN

## 2017-04-22 MED ORDER — LACTATED RINGERS IV SOLN
INTRAVENOUS | Status: DC
  Administered 2017-04-22 – 2017-04-23 (×2): via INTRAVENOUS

## 2017-04-22 MED ORDER — LIDOCAINE HCL (PF) 1 % IJ SOLN
30.0000 mL | INTRAMUSCULAR | Status: DC | PRN
  Filled 2017-04-22: qty 30

## 2017-04-22 MED ORDER — SOD CITRATE-CITRIC ACID 500-334 MG/5ML PO SOLN: 30.0000 mL | ORAL | Status: DC | PRN

## 2017-04-22 MED ORDER — SODIUM CHLORIDE 0.9 % IV SOLN
INTRAVENOUS | Status: DC | PRN
  Administered 2017-04-22 (×2): 5 mL via EPIDURAL

## 2017-04-22 MED ORDER — LIDOCAINE HCL (PF) 1 % IJ SOLN
INTRAMUSCULAR | Status: DC | PRN
  Administered 2017-04-22: 3 mL via SUBCUTANEOUS

## 2017-04-22 MED ORDER — FENTANYL 2.5 MCG/ML W/ROPIVACAINE 0.15% IN NS 100 ML EPIDURAL (ARMC)
EPIDURAL | Status: DC | PRN
  Administered 2017-04-22: 12 mL/h via EPIDURAL

## 2017-04-22 MED ORDER — LACTATED RINGERS IV SOLN
500.0000 mL | INTRAVENOUS | Status: DC | PRN
  Administered 2017-04-23: 250 mL via INTRAVENOUS

## 2017-04-22 MED ORDER — AMMONIA AROMATIC IN INHA
RESPIRATORY_TRACT | Status: AC
  Filled 2017-04-22: qty 10

## 2017-04-22 MED ORDER — FENTANYL 2.5 MCG/ML W/ROPIVACAINE 0.15% IN NS 100 ML EPIDURAL (ARMC)
EPIDURAL | Status: AC
  Filled 2017-04-22: qty 100

## 2017-04-22 MED ORDER — OXYCODONE-ACETAMINOPHEN 5-325 MG PO TABS: 2.0000 | ORAL_TABLET | ORAL | Status: DC | PRN

## 2017-04-22 MED ORDER — ONDANSETRON HCL 4 MG/2ML IJ SOLN
4.0000 mg | Freq: Four times a day (QID) | INTRAMUSCULAR | Status: DC | PRN
  Administered 2017-04-23: 4 mg via INTRAVENOUS
  Filled 2017-04-22: qty 2

## 2017-04-22 NOTE — H&P (Signed)
Obstetric History and Physical  Autumn Williams is a 21 y.o. G1P0 with IUP at [redacted]w[redacted]d presenting with irregular contractions since 15:30 today and small LOF at 17:30 today. Patient states she has been having  irregular, every 3-6 minutes contractions, minimal vaginal bleeding, ruptured, clear fluid membranes, with active fetal movement.    Prenatal Course Source of Care: [facility]  Pregnancy complications or risks:none  Prenatal labs and studies: ABO, Rh: --/--/O POS (08/18 1940) Antibody: NEG (08/18 1940) Rubella: 16.30 (01/29 1112) RPR: Non Reactive (01/29 1112)  HBsAg: Negative (01/29 1112)  HIV:   negative FGH:WEXHBZJI (08/02 1132) 1 hr Glucola  normal Genetic screening normal Anatomy US normal  Past Medical History:  Diagnosis Date  . Amenorrhea   . Medical history non-contributory     Past Surgical History:  Procedure Laterality Date  . NO PAST SURGERIES    . none      OB History  Gravida Para Term Preterm AB Living  1            SAB TAB Ectopic Multiple Live Births               # Outcome Date GA Lbr Len/2nd Weight Sex Delivery Anes PTL Lv  1 Current               Social History   Social History  . Marital status: Single    Spouse name: N/A  . Number of children: N/A  . Years of education: N/A   Social History Main Topics  . Smoking status: Former Smoker    Types: Cigarettes    Quit date: 08/2016  . Smokeless tobacco: Never Used  . Alcohol use Yes     Comment: occ  . Drug use: No  . Sexual activity: Yes    Partners: Male    Birth control/ protection: Pill   Other Topics Concern  . None   Social History Narrative  . None    Family History  Problem Relation Age of Onset  . Cancer Neg Hx   . Diabetes Neg Hx   . Heart failure Neg Hx     Prescriptions Prior to Admission  Medication Sig Dispense Refill Last Dose  . Prenatal Vit-DSS-Fe Cbn-FA (PRENA-CAP PO) Take by mouth.   04/21/2017 at Unknown time    No Known Allergies  Review of Systems:  Negative except for what is mentioned in HPI.  Physical Exam: BP 126/81 (BP Location: Left Arm)   Pulse (!) 101   Temp 98.1 F (36.7 C) (Oral)   Resp 16   Ht 5\' 4"  (1.626 m)   Wt 199 lb (90.3 kg)   LMP 07/24/2016 (Approximate)   BMI 34.16 kg/m  GENERAL: Well-developed, well-nourished female in no acute distress.  LUNGS: Clear to auscultation bilaterally.  HEART: Regular rate and rhythm. ABDOMEN: Soft, nontender, nondistended, gravid. EXTREMITIES: Nontender, no edema, 2+ distal pulses. Cervical Exam: Dilation: 4.5 Effacement (%): 70 Cervical Position: Middle Station: -1 Presentation: Vertex Exam by:: Penny Frisbie FHT:  Baseline rate 145 bpm   Variability moderate  Accelerations present   Decelerations none Contractions: Every 3-5 mins   Pertinent Labs/Studies:   Results for orders placed or performed during the hospital encounter of 04/22/17 (from the past 24 hour(s))  CBC     Status: None   Collection Time: 04/22/17  7:40 PM  Result Value Ref Range   WBC 8.6 3.6 - 11.0 K/uL   RBC 4.08 3.80 - 5.20 MIL/uL   Hemoglobin 12.8 12.0 -  16.0 g/dL   HCT 82.9 56.2 - 13.0 %   MCV 88.3 80.0 - 100.0 fL   MCH 31.5 26.0 - 34.0 pg   MCHC 35.7 32.0 - 36.0 g/dL   RDW 86.5 78.4 - 69.6 %   Platelets 249 150 - 440 K/uL  Type and screen [facility]     Status: None   Collection Time: 04/22/17  7:40 PM  Result Value Ref Range   ABO/RH(D) O POS    Antibody Screen NEG    Sample Expiration 04/25/2017     Assessment : Tressie Ragin is a 21 y.o. G1P0 at [redacted]w[redacted]d being admitted for labor.  Plan: Labor: Expectant management.  Induction/Augmentation as needed, per protocol FWB: Reassuring fetal heart tracing.  GBS negative Delivery plan: Hopeful for vaginal delivery  Shatha Hooser,CNM [facility]

## 2017-04-22 NOTE — OB Triage Note (Signed)
Pt presents to triage for complaints of ctx every 2-3 mins and LOF starting around 530pm some mild bleeding  Also noted.

## 2017-04-22 NOTE — Anesthesia Procedure Notes (Signed)
Epidural Patient location during procedure: OB Start time: 04/22/2017 11:36 PM End time: 04/22/2017 11:48 PM  Staffing Anesthesiologist: Lenard Simmer Performed: anesthesiologist   Preanesthetic Checklist Completed: patient identified, site marked, surgical consent, pre-op evaluation, timeout performed, IV checked, risks and benefits discussed and monitors and equipment checked  Epidural Patient position: sitting Prep: ChloraPrep Patient monitoring: heart rate, continuous pulse ox and blood pressure Approach: midline Location: L3-L4 Injection technique: LOR saline  Needle:  Needle type: Tuohy  Needle gauge: 17 G Needle length: 9 cm and 9 Needle insertion depth: 6.5 cm Catheter type: closed end flexible Catheter size: 19 Gauge Catheter at skin depth: 11.5 cm Test dose: negative and 1.5% lidocaine with Epi 1:200 K  Assessment Sensory level: T10 Events: blood not aspirated, injection not painful, no injection resistance, negative IV test and no paresthesia  Additional Notes Pt. Evaluated and documentation done after procedure finished. Patient identified. Risks/Benefits/Options discussed with patient including but not limited to bleeding, infection, nerve damage, paralysis, failed block, incomplete pain control, headache, blood pressure changes, nausea, vomiting, reactions to medication both or allergic, itching and postpartum back pain. Confirmed with bedside nurse the patient's most recent platelet count. Confirmed with patient that they are not currently taking any anticoagulation, have any bleeding history or any family history of bleeding disorders. Patient expressed understanding and wished to proceed. All questions were answered. Sterile technique was used throughout the entire procedure. Please see nursing notes for vital signs. Test dose was given through epidural catheter and negative prior to continuing to dose epidural or start infusion. Warning signs of high block given  to the patient including shortness of breath, tingling/numbness in hands, complete motor block, or any concerning symptoms with instructions to call for help. Patient was given instructions on fall risk and not to get out of bed. All questions and concerns addressed with instructions to call with any issues or inadequate analgesia.   Patient tolerated the insertion well without immediate complications.Reason for block:procedure for pain

## 2017-04-23 DIAGNOSIS — Z3A38 38 weeks gestation of pregnancy: Secondary | ICD-10-CM

## 2017-04-23 MED ORDER — SENNOSIDES-DOCUSATE SODIUM 8.6-50 MG PO TABS
2.0000 | ORAL_TABLET | ORAL | Status: DC
  Administered 2017-04-24: 2 via ORAL
  Filled 2017-04-23: qty 2

## 2017-04-23 MED ORDER — ONDANSETRON HCL 4 MG PO TABS
4.0000 mg | ORAL_TABLET | ORAL | Status: DC | PRN
Start: 2017-04-23 — End: 2017-04-24

## 2017-04-23 MED ORDER — COCONUT OIL OIL
1.0000 "application " | TOPICAL_OIL | Status: DC | PRN
  Administered 2017-04-24: 1 via TOPICAL
  Filled 2017-04-23: qty 120

## 2017-04-23 MED ORDER — WITCH HAZEL-GLYCERIN EX PADS: 1.0000 "application " | MEDICATED_PAD | CUTANEOUS | Status: DC | PRN

## 2017-04-23 MED ORDER — VARICELLA VIRUS VACCINE LIVE 1350 PFU/0.5ML IJ SUSR
0.5000 mL | Freq: Once | INTRAMUSCULAR | Status: AC
  Administered 2017-04-24: 0.5 mL via SUBCUTANEOUS
  Filled 2017-04-23 (×2): qty 0.5

## 2017-04-23 MED ORDER — ONDANSETRON HCL 4 MG/2ML IJ SOLN: 4.0000 mg | INTRAMUSCULAR | Status: DC | PRN

## 2017-04-23 MED ORDER — DIBUCAINE 1 % RE OINT: 1.0000 "application " | TOPICAL_OINTMENT | RECTAL | Status: DC | PRN

## 2017-04-23 MED ORDER — LIDOCAINE-EPINEPHRINE (PF) 1.5 %-1:200000 IJ SOLN
INTRAMUSCULAR | Status: DC | PRN
  Administered 2017-04-22: 3 mL via EPIDURAL

## 2017-04-23 MED ORDER — IBUPROFEN 600 MG PO TABS
600.0000 mg | ORAL_TABLET | Freq: Four times a day (QID) | ORAL | Status: DC
  Administered 2017-04-23 – 2017-04-24 (×6): 600 mg via ORAL
  Filled 2017-04-23 (×6): qty 1

## 2017-04-23 MED ORDER — SIMETHICONE 80 MG PO CHEW: 80.0000 mg | CHEWABLE_TABLET | ORAL | Status: DC | PRN

## 2017-04-23 MED ORDER — BENZOCAINE-MENTHOL 20-0.5 % EX AERO: 1.0000 "application " | INHALATION_SPRAY | CUTANEOUS | Status: DC | PRN

## 2017-04-23 MED ORDER — ACETAMINOPHEN 325 MG PO TABS: 650.0000 mg | ORAL_TABLET | ORAL | Status: DC | PRN

## 2017-04-23 MED ORDER — DIPHENHYDRAMINE HCL 25 MG PO CAPS: 25.0000 mg | ORAL_CAPSULE | Freq: Four times a day (QID) | ORAL | Status: DC | PRN

## 2017-04-23 MED ORDER — PRENATAL MULTIVITAMIN CH
1.0000 | ORAL_TABLET | Freq: Every day | ORAL | Status: DC
  Administered 2017-04-23 – 2017-04-24 (×2): 1 via ORAL
  Filled 2017-04-23 (×2): qty 1

## 2017-04-23 MED ORDER — ZOLPIDEM TARTRATE 5 MG PO TABS: 5.0000 mg | ORAL_TABLET | Freq: Every evening | ORAL | Status: DC | PRN

## 2017-04-23 NOTE — Plan of Care (Signed)
Problem: Life Cycle: Goal: Risk for postpartum hemorrhage will decrease Outcome: Progressing U/-2 with scant Lochia  Problem: Pain Management: Goal: General experience of comfort will improve and pain level will decrease Outcome: Progressing Denies Pain. Receiving Ibuprofen as per scheduled order.

## 2017-04-24 LAB — CBC
HCT: 32.4 % — ABNORMAL LOW (ref 35.0–47.0)
Hemoglobin: 11.3 g/dL — ABNORMAL LOW (ref 12.0–16.0)
MCH: 31.1 pg (ref 26.0–34.0)
MCHC: 34.9 g/dL (ref 32.0–36.0)
MCV: 89.2 fL (ref 80.0–100.0)
PLATELETS: 208 10*3/uL (ref 150–440)
RBC: 3.63 MIL/uL — ABNORMAL LOW (ref 3.80–5.20)
RDW: 13.7 % (ref 11.5–14.5)
WBC: 12 10*3/uL — AB (ref 3.6–11.0)

## 2017-04-24 MED ORDER — IBUPROFEN 600 MG PO TABS: 600.0000 mg | ORAL_TABLET | Freq: Four times a day (QID) | ORAL | 0 refills | Status: DC

## 2017-04-24 NOTE — Anesthesia Postprocedure Evaluation (Signed)
Anesthesia Post Note  Patient: Autumn Williams  Procedure(s) Performed: * No procedures listed *  Patient location during evaluation: Mother Baby Anesthesia Type: Epidural Level of consciousness: awake and alert Pain management: pain level controlled Vital Signs Assessment: post-procedure vital signs reviewed and stable Respiratory status: spontaneous breathing, nonlabored ventilation and respiratory function stable Cardiovascular status: stable Postop Assessment: no headache, no backache and epidural receding Anesthetic complications: no     Last Vitals:  Vitals:   04/23/17 2312 04/24/17 0334  BP: (!) 110/56 136/86  Pulse: 90 97  Resp: 18 18  Temp: 36.8 C 36.4 C  SpO2: 98% 99%    Last Pain:  Vitals:   04/24/17 0546  TempSrc:   PainSc: 0-No pain                 Mathews Argyle P

## 2017-04-24 NOTE — Progress Notes (Signed)
Discharge order received from doctor. Varicella vaccine given prior to discharge. Reviewed discharge instructions and prescriptions with patient and answered all questions. Follow up appointment instructions given. Patient verbalized understanding. ID bands checked. Patient discharged home with infant via wheelchair by nursing/auxillary.    Oswald Hillock, RN

## 2017-04-24 NOTE — Discharge Summary (Signed)
Obstetric Discharge Summary  Patient ID: Autumn Williams MRN: [id number] DOB/AGE: 1996/08/07 21 y.o.   Date of Admission: 04/22/2017 Harlow Mares, CNM Priscille Loveless, MD)  Date of Discharge: 04/24/2017 Serafina Royals, CNM Horton Marshall, MD)  Admitting Diagnosis: Onset of Labor at [redacted]w[redacted]d  Secondary Diagnosis: None  Mode of Delivery: normal spontaneous vaginal delivery     Discharge Diagnosis: No other diagnosis   Intrapartum Procedures: epidural   Post partum procedures: None  Complications: none   Brief Hospital Course   Autumn Williams is a G1P1001 who had a SVD on 04/23/2017;  for further details of this surgery, please refer to the delivey note.  Patient had an uncomplicated postpartum course.  By time of discharge on PPD#1, her pain was controlled on oral pain medications; she had appropriate lochia and was ambulating, voiding without difficulty and tolerating regular diet.  She was deemed stable for discharge to home.    Labs: CBC Latest Ref Rng & Units 04/24/2017 04/22/2017 04/10/2017  WBC 3.6 - 11.0 K/uL 12.0(H) 8.6 7.6  Hemoglobin 12.0 - 16.0 g/dL 11.3(L) 12.8 12.6  Hematocrit 35.0 - 47.0 % 32.4(L) 36.0 36.1  Platelets 150 - 440 K/uL 208 249 262   O POS  Physical exam:   Temp:  [97.7 F (36.5 C)-98 F (36.7 C)] 98 F (36.7 C) (08/20 1148) Pulse Rate:  [71] 71 (08/20 0742) Resp:  [20] 20 (08/20 0742) BP: (114)/(77) 114/77 (08/20 0742) SpO2:  [98 %] 98 % (08/20 0742)  General: alert and no distress  Breast: nipples intact, free from cracks or bleeding  Lochia: appropriate  Abdomen: soft, NT  Uterine Fundus: firm  Perineum: intact, no swelling or redness  Extremities: No evidence of DVT seen on physical exam. No lower extremity edema.  Discharge Instructions: Per After Visit Summary.  Activity: Advance as tolerated. Pelvic rest for 6 weeks.  Also refer to After Visit Summary  Diet: Regular  Medications: Allergies as of 04/24/2017   No Known  Allergies     Medication List    TAKE these medications   ibuprofen 600 MG tablet Commonly known as:  ADVIL,MOTRIN Take 1 tablet (600 mg total) by mouth every 6 (six) hours.   PRENA-CAP PO Take by mouth.      Outpatient follow up:  Follow-up Information    Benbrook, Melodye Ped, CNM. Schedule an appointment as soon as possible for a visit in 6 week(s).   Specialties:  Obstetrics and Gynecology, Radiology Why:  Please call to make your 6 week postpartum follow up appointment  Contact information: [address] [place] [address] [phone]          Postpartum contraception: IUD  Discharged Condition: stable  Discharged to: home   Newborn Data:  Disposition:home with mother  Apgars: APGAR (1 MIN): 9   APGAR (5 MINS): 9    Baby Feeding: Breast   Gunnar Bulla, [place] 04/24/2017 1434

## 2017-04-24 NOTE — Anesthesia Preprocedure Evaluation (Signed)
Anesthesia Evaluation  Patient identified by MRN, date of birth, ID band Patient awake    Reviewed: Allergy & Precautions, H&P , NPO status , Patient's Chart, lab work & pertinent test results, reviewed documented beta blocker date and time   History of Anesthesia Complications Negative for: history of anesthetic complications  Airway Mallampati: III  TM Distance: >3 FB Neck ROM: full    Dental  (+) Teeth Intact, Dental Advidsory Given   Pulmonary neg pulmonary ROS, former smoker,           Cardiovascular Exercise Tolerance: Good negative cardio ROS       Neuro/Psych negative neurological ROS  negative psych ROS   GI/Hepatic Neg liver ROS, GERD  ,  Endo/Other  negative endocrine ROS  Renal/GU negative Renal ROS  negative genitourinary   Musculoskeletal   Abdominal   Peds  Hematology negative hematology ROS (+)   Anesthesia Other Findings Past Medical History: No date: Amenorrhea No date: Medical history non-contributory   Reproductive/Obstetrics (+) Pregnancy                             Anesthesia Physical Anesthesia Plan  ASA: II  Anesthesia Plan: Epidural   Post-op Pain Management:    Induction:   PONV Risk Score and Plan:   Airway Management Planned:   Additional Equipment:   Intra-op Plan:   Post-operative Plan:   Informed Consent: I have reviewed the patients History and Physical, chart, labs and discussed the procedure including the risks, benefits and alternatives for the proposed anesthesia with the patient or authorized representative who has indicated his/her understanding and acceptance.   Dental Advisory Given  Plan Discussed with: Anesthesiologist, CRNA and Surgeon  Anesthesia Plan Comments:         Anesthesia Quick Evaluation

## 2017-04-25 LAB — RPR: RPR Ser Ql: NONREACTIVE

## 2017-04-28 ENCOUNTER — Encounter: Payer: Medicaid Other | Admitting: Certified Nurse Midwife

## 2017-05-26 ENCOUNTER — Encounter: Payer: Self-pay | Admitting: Certified Nurse Midwife

## 2017-06-06 ENCOUNTER — Ambulatory Visit (INDEPENDENT_AMBULATORY_CARE_PROVIDER_SITE_OTHER): Payer: Medicaid Other | Admitting: Obstetrics and Gynecology

## 2017-06-06 ENCOUNTER — Encounter: Payer: Self-pay | Admitting: Obstetrics and Gynecology

## 2017-06-06 DIAGNOSIS — Z23 Encounter for immunization: Secondary | ICD-10-CM

## 2017-06-06 NOTE — Progress Notes (Signed)
  Subjective:     Autumn Williams is a 21 y.o. female who presents for a postpartum visit. She is 6 weeks postpartum following a spontaneous vaginal delivery. I have fully reviewed the prenatal and intrapartum course. The delivery was at 39 gestational weeks. Outcome: spontaneous vaginal delivery. Anesthesia: epidural. Postpartum course has been uncomplicated. Baby's course has been uncomplicated. Baby is feeding by breast. Bleeding no bleeding. Bowel function is normal. Bladder function is normal. Patient is not sexually active. Contraception method is abstinence. Postpartum depression screening: negative.  The following portions of the patient's history were reviewed and updated as appropriate: allergies, current medications, past family history, past medical history, past social history, past surgical history and problem list.  Review of Systems A comprehensive review of systems was negative.   Objective:    There were no vitals taken for this visit.  General:  alert, cooperative and appears stated age   Breasts:  inspection negative, no nipple discharge or bleeding, no masses or nodularity palpable  Lungs: clear to auscultation bilaterally  Heart:  regular rate and rhythm, S1, S2 normal, no murmur, click, rub or gallop  Abdomen: soft, non-tender; bowel sounds normal; no masses,  no organomegaly   Vulva:  normal  Vagina: normal vagina  Cervix:  multiparous appearance  Corpus: normal size, contour, position, consistency, mobility, non-tender  Adnexa:  no mass, fullness, tenderness  Rectal Exam: Not performed.        Assessment:     6 weeks postpartum exam. Pap smear not done at today's visit. needs flu vaccine  Plan:    1. Contraception: IUD 2. Labs obtained-will follow up accordingly 3. Follow up in: 1 week for Mirena insertion and then 8 weeks for IUD check/pap as needed.   4. Flu vaccine given

## 2017-06-06 NOTE — Patient Instructions (Signed)
  Place postpartum visit patient instructions here.  

## 2017-06-11 LAB — CBC
Hematocrit: 40.9 % (ref 34.0–46.6)
Hemoglobin: 15 g/dL (ref 11.1–15.9)
MCH: 30.1 pg (ref 26.6–33.0)
MCHC: 36.7 g/dL — ABNORMAL HIGH (ref 31.5–35.7)
MCV: 82 fL (ref 79–97)
PLATELETS: 241 10*3/uL (ref 150–379)
RBC: 4.99 x10E6/uL (ref 3.77–5.28)
RDW: 13.2 % (ref 12.3–15.4)
WBC: 5.5 10*3/uL (ref 3.4–10.8)

## 2017-06-11 LAB — FERRITIN: FERRITIN: 34 ng/mL (ref 15–150)

## 2017-06-11 LAB — VITAMIN D 25 HYDROXY (VIT D DEFICIENCY, FRACTURES): Vit D, 25-Hydroxy: 35.1 ng/mL (ref 30.0–100.0)

## 2017-06-15 ENCOUNTER — Ambulatory Visit (INDEPENDENT_AMBULATORY_CARE_PROVIDER_SITE_OTHER): Payer: Medicaid Other | Admitting: Obstetrics and Gynecology

## 2017-06-15 ENCOUNTER — Encounter: Payer: Self-pay | Admitting: Obstetrics and Gynecology

## 2017-06-15 VITALS — BP 120/78 | HR 99 | Ht 64.0 in | Wt 173.8 lb

## 2017-06-15 DIAGNOSIS — Z30430 Encounter for insertion of intrauterine contraceptive device: Secondary | ICD-10-CM

## 2017-06-15 MED ORDER — LEVONORGESTREL 20 MCG/24HR IU IUD: INTRAUTERINE_SYSTEM | Freq: Once | INTRAUTERINE | Status: AC

## 2017-06-15 NOTE — Progress Notes (Signed)
Autumn Williams is a 21 y.o. year old G31P1001 Hispanic female who presents for placement of a Mirena IUD.  No LMP recorded. Patient is not currently having periods (Reason: Lactating). BP 120/78   Pulse 99   Ht  (1.626 m)   Wt 173 lb 12.8 oz (78.8 kg)   Breastfeeding? Yes   BMI 29.83 kg/m  Last sexual intercourse was before delivery, and pregnancy test today was negative  The risks and benefits of the method and placement have been thouroughly reviewed with the patient and all questions were answered.  Specifically the patient is aware of failure rate of 09/998, expulsion of the IUD and of possible perforation.  The patient is aware of irregular bleeding due to the method and understands the incidence of irregular bleeding diminishes with time.  Signed copy of informed consent in chart.   Time out was performed.  A graves speculum was placed in the vagina.  The cervix was visualized, prepped using Betadine, and grasped with a single tooth tenaculum. The uterus was found to be neutral and it sounded to 7 cm.  Mirena IUD placed per manufacturer's recommendations.   The strings were trimmed to 3 cm.  The patient was given post procedure instructions, including signs and symptoms of infection and to check for the strings after each menses or each month, and refraining from intercourse or anything in the vagina for 3 days.  She was given a Mirena care card with date Mirena placed, and date Mirena to be removed.    Fred Franzen Suzan Nailer, CNM

## 2017-06-15 NOTE — Patient Instructions (Signed)

## 2017-08-16 ENCOUNTER — Encounter: Payer: Medicaid Other | Admitting: Obstetrics and Gynecology

## 2017-10-11 ENCOUNTER — Encounter: Payer: Self-pay | Admitting: Certified Nurse Midwife

## 2017-10-11 ENCOUNTER — Encounter: Payer: Medicaid Other | Admitting: Obstetrics and Gynecology

## 2017-10-11 ENCOUNTER — Ambulatory Visit (INDEPENDENT_AMBULATORY_CARE_PROVIDER_SITE_OTHER): Payer: Medicaid Other | Admitting: Certified Nurse Midwife

## 2017-10-11 VITALS — BP 118/81 | HR 65 | Ht 64.0 in | Wt 171.2 lb

## 2017-10-11 DIAGNOSIS — Z30431 Encounter for routine checking of intrauterine contraceptive device: Secondary | ICD-10-CM | POA: Diagnosis not present

## 2017-10-11 NOTE — Patient Instructions (Signed)

## 2017-10-11 NOTE — Progress Notes (Signed)
Pt is here for an IUD check and pap smear.

## 2017-10-11 NOTE — Progress Notes (Signed)
  GYNECOLOGY OFFICE PROGRESS NOTE  History:  22 y.o. G1P1001 here today for today for IUD string check; Mirena IUD was placed  06/15/17. No complaints about the IUD, no concerning side effects.  The following portions of the patient's history were reviewed and updated as appropriate: allergies, current medications, past family history, past medical history, past social history, past surgical history and problem list. Last pap smear on n/a.  Review of Systems:  Pertinent items are noted in HPI.   Objective:  Physical Exam Blood pressure 118/81, pulse 65, height 5\' 4"  (1.626 m), weight 171 lb 3 oz (77.7 kg), currently breastfeeding. CONSTITUTIONAL: Well-developed, well-nourished female in no acute distress.  HENT:  Normocephalic, atraumatic. External right and left ear normal. Oropharynx is clear and moist EYES: Conjunctivae and EOM are normal.. No scleral icterus.  NECK: Normal range of motion, supple, no masses CARDIOVASCULAR: Normal heart rate noted RESPIRATORY: Effort and breath sounds normal, no problems with respiration noted ABDOMEN: Soft, no distention noted.   PELVIC: Normal appearing external genitalia; normal appearing vaginal mucosa and cervix.  IUD strings visualized, about 3 cm in length outside cervix.   Assessment & Plan:  Normal IUD check. Patient to keep IUD in place for five years; can come in for removal if she desires pregnancy within the next five years. Routine preventative health maintenance measures emphasized.Return for annual/pap smear at earliest convenience   Doreene BurkeAnnie Dakiya Puopolo, [place]

## 2017-12-31 IMAGING — US US OB COMP +14 WK
1 series · 14 of 28 positions shown · non-contrast
Comparison: none

[Series 1: us ob comp +14 wk · 0.25mm/px · 14 of 61 slices shown]
[im 3/61]
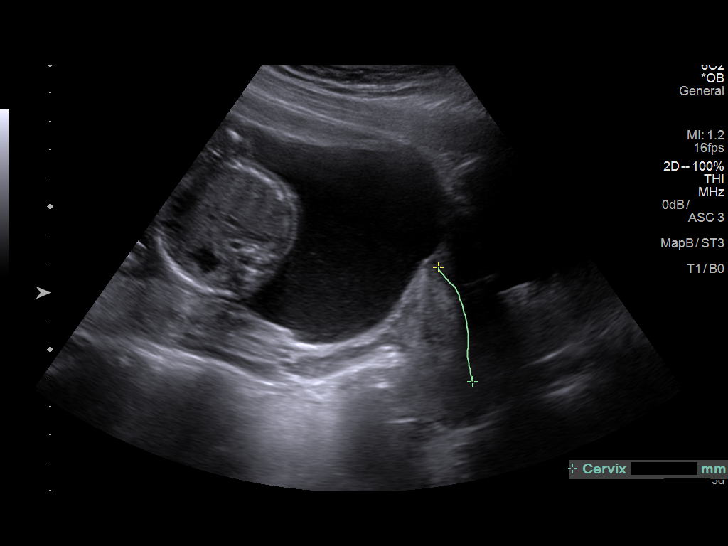
[im 7/61]
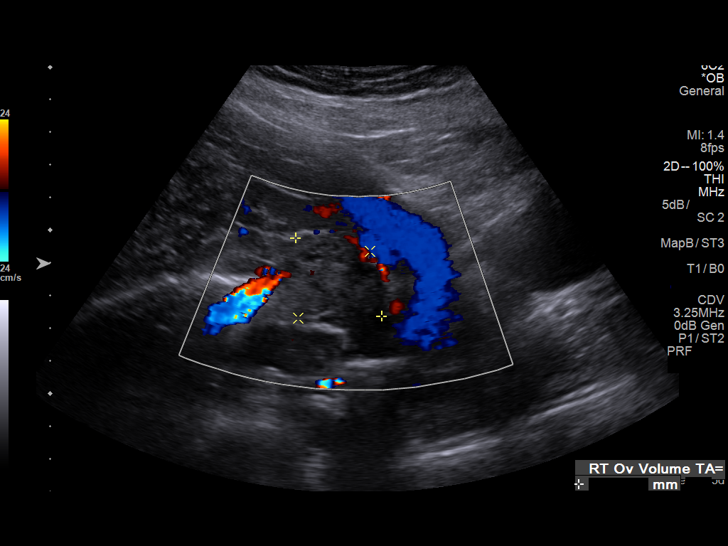
[im 12/61]
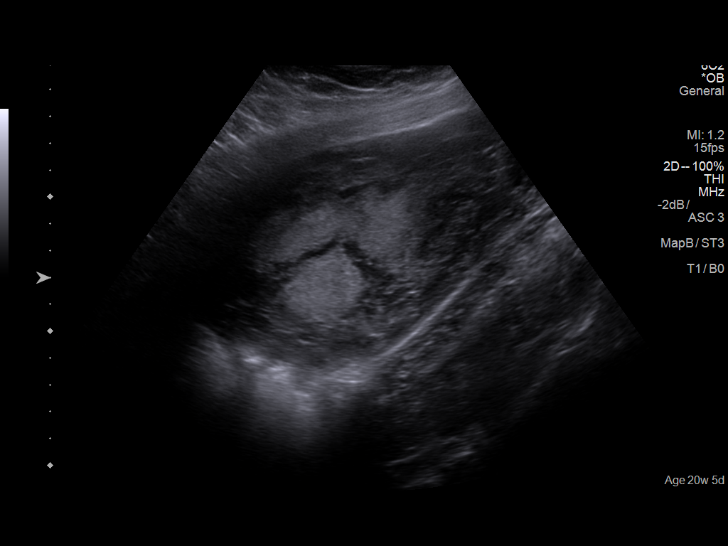
[im 16/61]
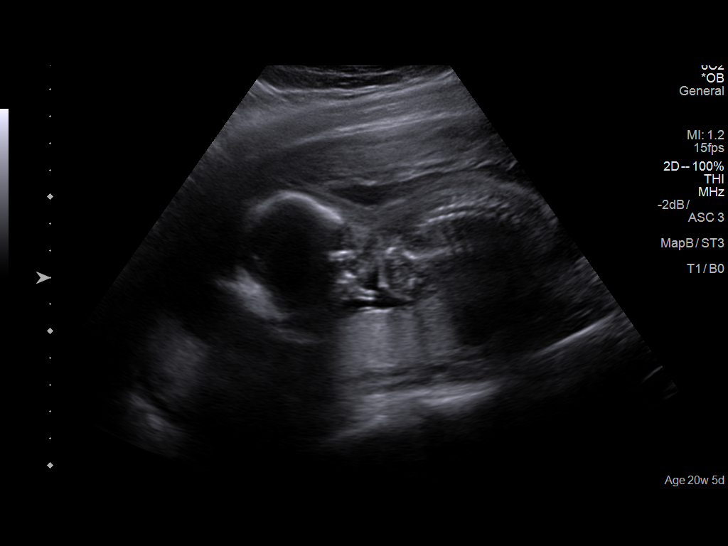
[im 21/61]
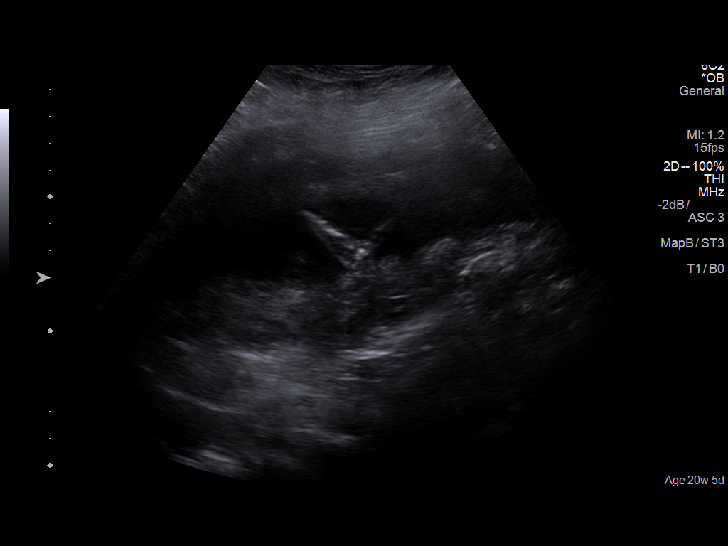
[im 25/61]
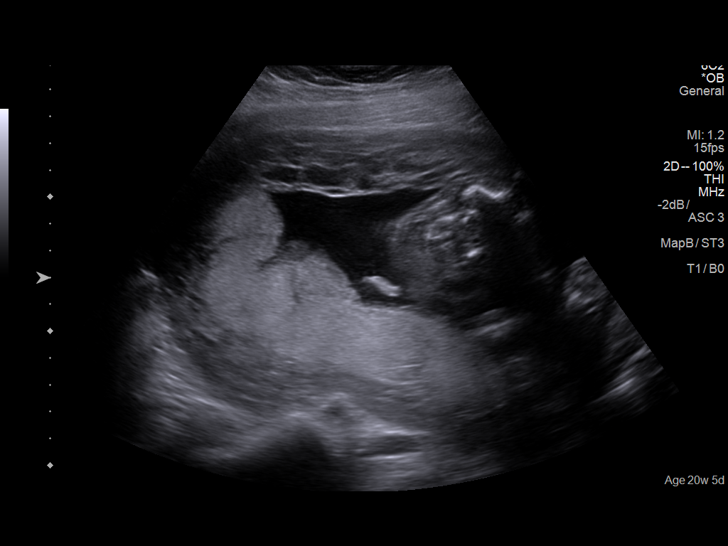
[im 29/61]
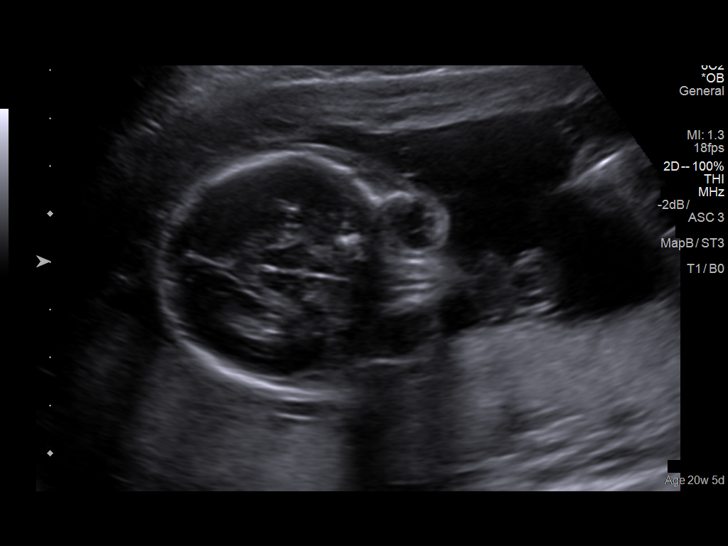
[im 34/61]
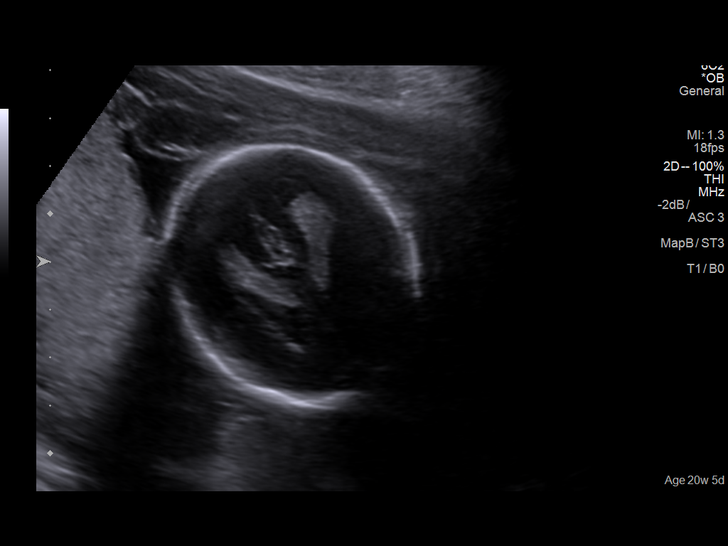
[im 38/61]
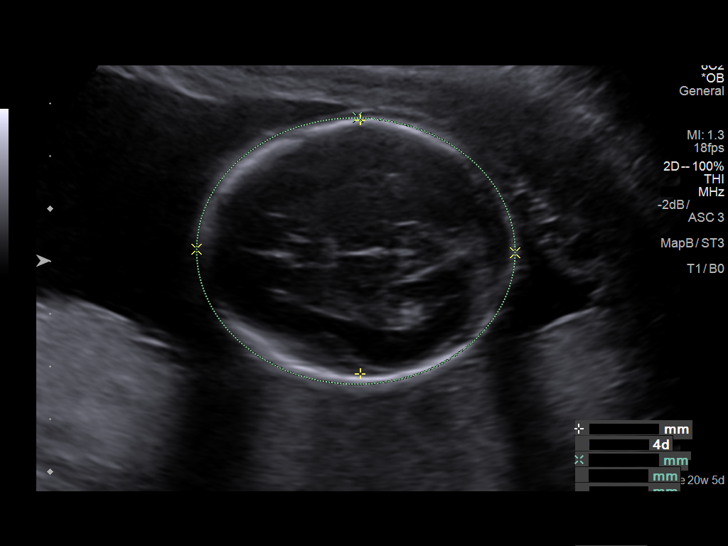
[im 43/61]
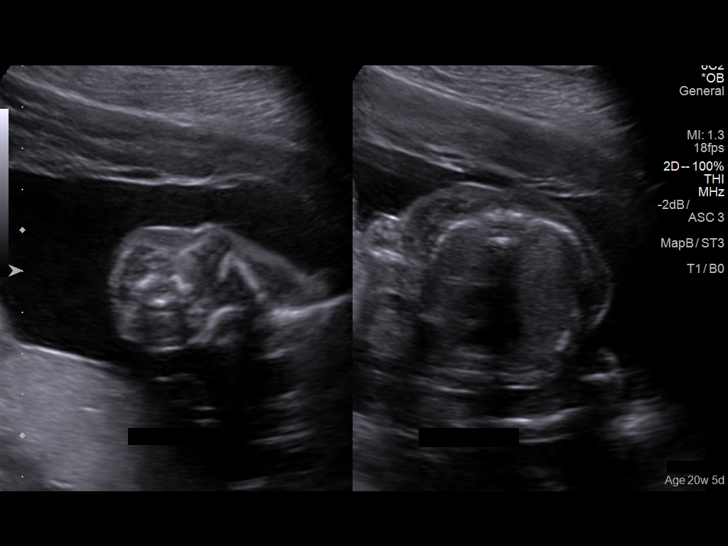
[im 47/61]
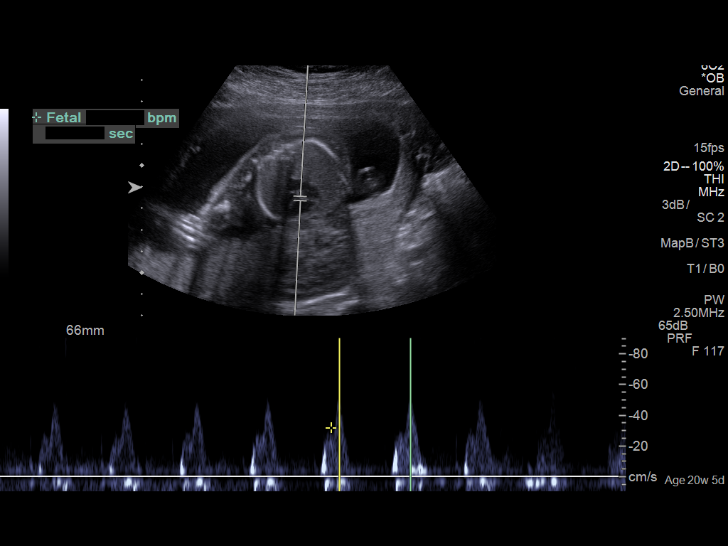
[im 52/61]
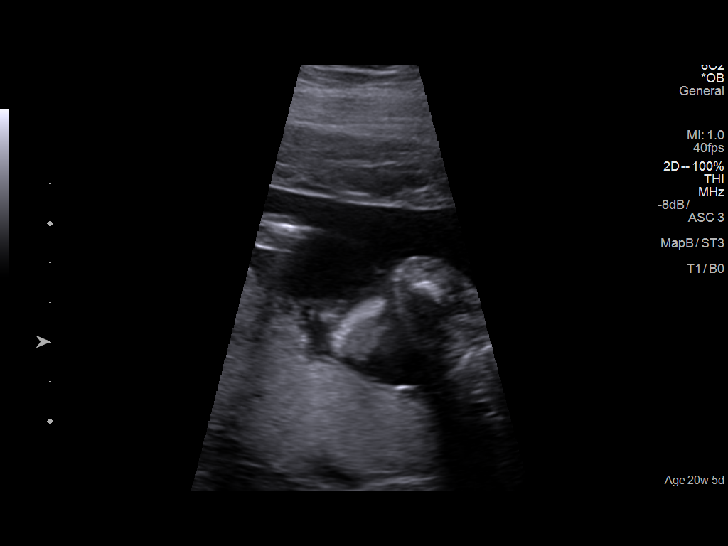
[im 56/61]
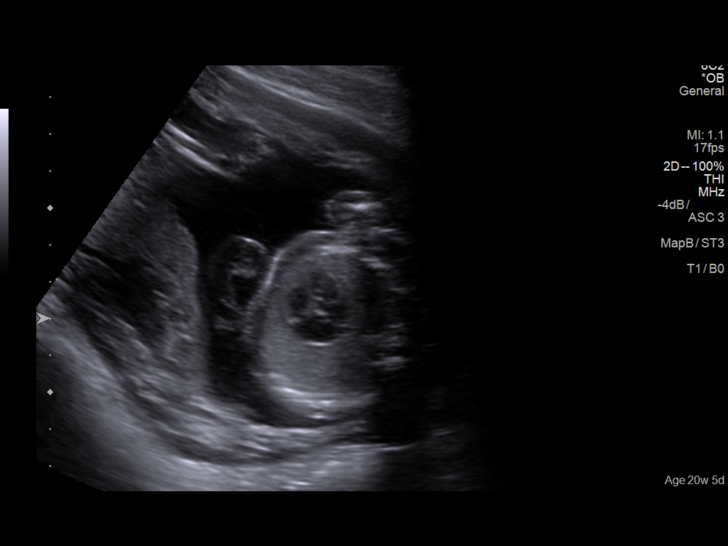
[im 61/61]
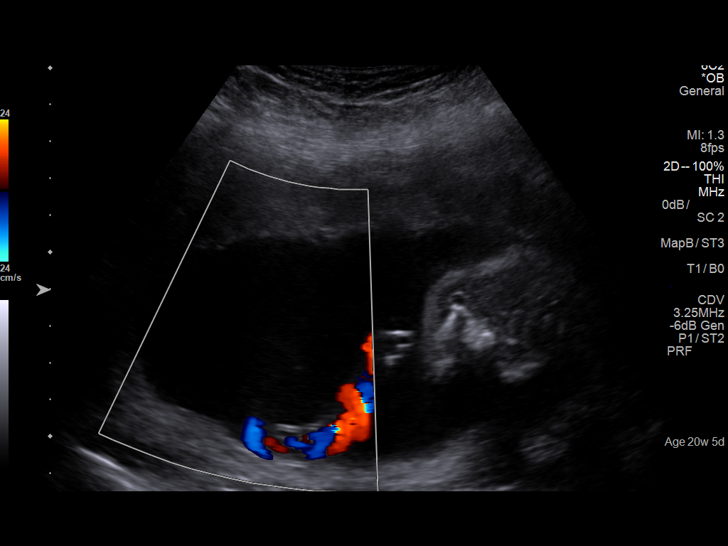

[14 of 28 positions shown; findings below may reference images not displayed]

Canned report from images found in remote index.

Refer to host system for actual result text.

## 2018-01-08 ENCOUNTER — Encounter: Payer: Medicaid Other | Admitting: Certified Nurse Midwife

## 2018-03-30 ENCOUNTER — Ambulatory Visit (INDEPENDENT_AMBULATORY_CARE_PROVIDER_SITE_OTHER): Payer: Medicaid Other | Admitting: Certified Nurse Midwife

## 2018-03-30 ENCOUNTER — Other Ambulatory Visit (HOSPITAL_COMMUNITY)
Admission: RE | Admit: 2018-03-30 | Discharge: 2018-03-30 | Disposition: A | Discharge status: Home / Self Care | Payer: Medicaid Other | Source: Ambulatory Visit | Attending: Certified Nurse Midwife | Admitting: Certified Nurse Midwife

## 2018-03-30 VITALS — BP 114/82 | HR 45 | Ht 64.0 in | Wt 178.1 lb

## 2018-03-30 DIAGNOSIS — Z124 Encounter for screening for malignant neoplasm of cervix: Secondary | ICD-10-CM

## 2018-03-30 DIAGNOSIS — Z6830 Body mass index (BMI) 30.0-30.9, adult: Secondary | ICD-10-CM

## 2018-03-30 DIAGNOSIS — Z113 Encounter for screening for infections with a predominantly sexual mode of transmission: Secondary | ICD-10-CM | POA: Diagnosis not present

## 2018-03-30 DIAGNOSIS — Z975 Presence of (intrauterine) contraceptive device: Secondary | ICD-10-CM

## 2018-03-30 DIAGNOSIS — Z309 Encounter for contraceptive management, unspecified: Secondary | ICD-10-CM

## 2018-03-30 DIAGNOSIS — Z01419 Encounter for gynecological examination (general) (routine) without abnormal findings: Secondary | ICD-10-CM

## 2018-03-30 NOTE — Progress Notes (Signed)
Pt is here for an annual exam and pap smear. States she has never had a pap smear.

## 2018-03-30 NOTE — Progress Notes (Signed)
ANNUAL PREVENTATIVE CARE GYN  ENCOUNTER NOTE  Subjective:       Autumn Williams is a 22 y.o. 661P1001 female here for a routine annual gynecologic exam.  Current complaints: 1. Needs Pap 2. Needs Annual labs   Just completed summer school. Doing well overall.   Denies difficulty breathing or respiratory distress, chest pain, abdominal pain, excessive vaginal bleeding, dysuria, and leg pain or swelling.    Gynecologic History  Patient's last menstrual period was 03/26/2018 (exact date).  Contraception: IUD, Mirena; inserted by M. Shambley on 06/15/2017.  Last Pap: today.   Obstetric History  OB History  Gravida Para Term Preterm AB Living  1 1 1     1   SAB TAB Ectopic Multiple Live Births        0 1    # Outcome Date GA Lbr Len/2nd Weight Sex Delivery Anes PTL Lv  1 Term 04/23/17 [id number] 12:56 / 00:35 7 lb 12.5 oz (3.53 kg) F Vag-Spont EPI  LIV     Birth Comments: natal teeth present at delivery    Past Medical History:  Diagnosis Date  . Amenorrhea   . Medical history non-contributory     Past Surgical History:  Procedure Laterality Date  . NO PAST SURGERIES    . none      No current outpatient medications on file prior to visit.   Current Facility-Administered Medications on File Prior to Visit  Medication Dose Route Frequency Provider Last Rate Last Dose  . levonorgestrel (MIRENA) 20 MCG/24HR IUD   Intrauterine Once Shambley, Melody N, CNM        No Known Allergies  Social History   Socioeconomic History  . Marital status: Single    Spouse name: Not on file  . Number of children: Not on file  . Years of education: Not on file  . Highest education level: Not on file  Occupational History  . Not on file  Social Needs  . Financial resource strain: Not on file  . Food insecurity:    Worry: Not on file    Inability: Not on file  . Transportation needs:    Medical: Not on file    Non-medical: Not on file  Tobacco Use  . Smoking status: Former Smoker   Types: Cigarettes    Last attempt to quit: 08/2016    Years since quitting: 1.6  . Smokeless tobacco: Never Used  Substance and Sexual Activity  . Alcohol use: Yes    Comment: occ  . Drug use: No  . Sexual activity: Yes    Partners: Male    Birth control/protection: IUD  Lifestyle  . Physical activity:    Days per week: Not on file    Minutes per session: Not on file  . Stress: Not on file  Relationships  . Social connections:    Talks on phone: Not on file    Gets together: Not on file    Attends religious service: Not on file    Active member of club or organization: Not on file    Attends meetings of clubs or organizations: Not on file    Relationship status: Not on file  . Intimate partner violence:    Fear of current or ex partner: Not on file    Emotionally abused: Not on file    Physically abused: Not on file    Forced sexual activity: Not on file  Other Topics Concern  . Not on file  Social History Narrative  .  Not on file    Family History  Problem Relation Age of Onset  . Cancer Neg Hx   . Diabetes Neg Hx   . Heart failure Neg Hx     The following portions of the patient's history were reviewed and updated as appropriate: allergies, current medications, past family history, past medical history, past social history, past surgical history and problem list.  Review of Systems  ROS negative except as noted above. Information obtained from patient.    Objective:   BP 114/82   Pulse (!) 45   Ht 5\' 4"  (1.626 m)   Wt 178 lb 2 oz (80.8 kg)   LMP 03/26/2018 (Exact Date)   Breastfeeding? No   BMI 30.58 kg/m    CONSTITUTIONAL: Well-developed, well-nourished female in no acute distress.   PSYCHIATRIC: Normal mood and affect. Normal  behavior. Normal judgment and thought content.  NEUROLGIC: Alert and oriented to person, place, and  time. Normal muscle tone coordination. No cranial nerve deficit noted.  HENT:  Normocephalic, atraumatic, External right  and left ear normal.   EYES: Conjunctivae and EOM are normal. Pupils are equal and round.    NECK: Normal range of motion, supple, no masses.  Normal thyroid.   SKIN: Skin is warm and dry. No rash noted. Not diaphoretic. No erythema. No pallor.  CARDIOVASCULAR: Normal heart rate noted, regular rhythm, no murmur.  RESPIRATORY: Clear to auscultation bilaterally. Effort and breath sounds normal, no problems with respiration noted.  BREASTS: Symmetric in size. No masses, skin changes, nipple drainage, or lymphadenopathy.  ABDOMEN: Soft, normal bowel sounds, no distention noted.  No tenderness, rebound or guarding. Obese.   PELVIC:  External Genitalia: Normal  Vagina: Normal  Cervix: Normal, IUD string present  Uterus: Normal  Adnexa: Normal    MUSCULOSKELETAL: Normal range of motion. No tenderness.  No cyanosis, clubbing, or edema.  2+ distal pulses.  LYMPHATIC: No Axillary, Supraclavicular, or Inguinal Adenopathy.  Assessment:   Annual gynecologic examination 22 y.o.   Contraception: IUD, Mirena   Obesity 1   Problem List Items Addressed This Visit    None    Visit Diagnoses    Well woman exam    -  Primary   Relevant Orders   Thyroid Panel With TSH   Lipid panel   CBC   Comprehensive metabolic panel   Hemoglobin A1c   Cytology - PAP   Screening for cervical cancer       Relevant Orders   Cytology - PAP   IUD (intrauterine device) in place       Screen for STD (sexually transmitted disease)       Relevant Orders   Cytology - PAP   BMI 30.0-30.9,adult       Relevant Orders   Thyroid Panel With TSH   Lipid panel   Hemoglobin A1c      Plan:   Pap: Pap, Reflex if ASCUS; will contact patient via MyChart with results  Labs: See orders; will contact patient via MyChart with results  Routine preventative health maintenance measures emphasized: Exercise/Diet/Weight control, Tobacco Warnings, Alcohol/Substance use risks, Stress Management, Peer Pressure Issues  and Safe Sex; see AVS  Reviewed red flag symptoms and when to call  RTC x 1 year for Annual Exam or sooner if needed.    Gunnar Bulla, CNM [facility], [facility]

## 2018-03-30 NOTE — Patient Instructions (Addendum)
Levonorgestrel intrauterine device (IUD) What is this medicine? LEVONORGESTREL IUD (LEE voe nor jes trel) is a contraceptive (birth control) device. The device is placed inside the uterus by a healthcare professional. It is used to prevent pregnancy. This device can also be used to treat heavy bleeding that occurs during your period. This medicine may be used for other purposes; ask your health care provider or pharmacist if you have questions. COMMON BRAND NAME(S): Minette Headland What should I tell my health care provider before I take this medicine? They need to know if you have any of these conditions: -abnormal Pap smear -cancer of the breast, uterus, or cervix -diabetes -endometritis -genital or pelvic infection now or in the past -have more than one sexual partner or your partner has more than one partner -heart disease -history of an ectopic or tubal pregnancy -immune system problems -IUD in place -liver disease or tumor -problems with blood clots or take blood-thinners -seizures -use intravenous drugs -uterus of unusual shape -vaginal bleeding that has not been explained -an unusual or allergic reaction to levonorgestrel, other hormones, silicone, or polyethylene, medicines, foods, dyes, or preservatives -pregnant or trying to get pregnant -breast-feeding How should I use this medicine? This device is placed inside the uterus by a health care professional. Talk to your pediatrician regarding the use of this medicine in children. Special care may be needed. Overdosage: If you think you have taken too much of this medicine contact a poison control center or emergency room at once. NOTE: This medicine is only for you. Do not share this medicine with others. What if I miss a dose? This does not apply. Depending on the brand of device you have inserted, the device will need to be replaced every 3 to 5 years if you wish to continue using this type of birth  control. What may interact with this medicine? Do not take this medicine with any of the following medications: -amprenavir -bosentan -fosamprenavir This medicine may also interact with the following medications: -aprepitant -armodafinil -barbiturate medicines for inducing sleep or treating seizures -bexarotene -boceprevir -griseofulvin -medicines to treat seizures like carbamazepine, ethotoin, felbamate, oxcarbazepine, phenytoin, topiramate -modafinil -pioglitazone -rifabutin -rifampin -rifapentine -some medicines to treat HIV infection like atazanavir, efavirenz, indinavir, lopinavir, nelfinavir, tipranavir, ritonavir -St. John's wort -warfarin This list may not describe all possible interactions. Give your health care provider a list of all the medicines, herbs, non-prescription drugs, or dietary supplements you use. Also tell them if you smoke, drink alcohol, or use illegal drugs. Some items may interact with your medicine. What should I watch for while using this medicine? Visit your doctor or health care professional for regular check ups. See your doctor if you or your partner has sexual contact with others, becomes HIV positive, or gets a sexual transmitted disease. This product does not protect you against HIV infection (AIDS) or other sexually transmitted diseases. You can check the placement of the IUD yourself by reaching up to the top of your vagina with clean fingers to feel the threads. Do not pull on the threads. It is a good habit to check placement after each menstrual period. Call your doctor right away if you feel more of the IUD than just the threads or if you cannot feel the threads at all. The IUD may come out by itself. You may become pregnant if the device comes out. If you notice that the IUD has come out use a backup birth control method like condoms and call your  health care provider. Using tampons will not change the position of the IUD and are okay to use  during your period. This IUD can be safely scanned with magnetic resonance imaging (MRI) only under specific conditions. Before you have an MRI, tell your healthcare provider that you have an IUD in place, and which type of IUD you have in place. What side effects may I notice from receiving this medicine? Side effects that you should report to your doctor or health care professional as soon as possible: -allergic reactions like skin rash, itching or hives, swelling of the face, lips, or tongue -fever, flu-like symptoms -genital sores -high blood pressure -no menstrual period for 6 weeks during use -pain, swelling, warmth in the leg -pelvic pain or tenderness -severe or sudden headache -signs of pregnancy -stomach cramping -sudden shortness of breath -trouble with balance, talking, or walking -unusual vaginal bleeding, discharge -yellowing of the eyes or skin Side effects that usually do not require medical attention (report to your doctor or health care professional if they continue or are bothersome): -acne -breast pain -change in sex drive or performance -changes in weight -cramping, dizziness, or faintness while the device is being inserted -headache -irregular menstrual bleeding within first 3 to 6 months of use -nausea This list may not describe all possible side effects. Call your doctor for medical advice about side effects. You may report side effects to FDA at 1-800-FDA-1088. Where should I keep my medicine? This does not apply. NOTE: This sheet is a summary. It may not cover all possible information. If you have questions about this medicine, talk to your doctor, pharmacist, or health care provider.  2018 Elsevier/Gold Standard (2016-06-03 14:14:56) Preventive Care 18-39 Years, Female Preventive care refers to lifestyle choices and visits with your health care provider that can promote health and wellness. What does preventive care include?  A yearly physical exam.  This is also called an annual well check.  Dental exams once or twice a year.  Routine eye exams. Ask your health care provider how often you should have your eyes checked.  Personal lifestyle choices, including: ? Daily care of your teeth and gums. ? Regular physical activity. ? Eating a healthy diet. ? Avoiding tobacco and drug use. ? Limiting alcohol use. ? Practicing safe sex. ? Taking vitamin and mineral supplements as recommended by your health care provider. What happens during an annual well check? The services and screenings done by your health care provider during your annual well check will depend on your age, overall health, lifestyle risk factors, and family history of disease. Counseling Your health care provider may ask you questions about your:  Alcohol use.  Tobacco use.  Drug use.  Emotional well-being.  Home and relationship well-being.  Sexual activity.  Eating habits.  Work and work environment.  Method of birth control.  Menstrual cycle.  Pregnancy history.  Screening You may have the following tests or measurements:  Height, weight, and BMI.  Diabetes screening. This is done by checking your blood sugar (glucose) after you have not eaten for a while (fasting).  Blood pressure.  Lipid and cholesterol levels. These may be checked every 5 years starting at age 20.  Skin check.  Hepatitis C blood test.  Hepatitis B blood test.  Sexually transmitted disease (STD) testing.  BRCA-related cancer screening. This may be done if you have a family history of breast, ovarian, tubal, or peritoneal cancers.  Pelvic exam and Pap test. This may be done every 3   years starting at age 14. Starting at age 51, this may be done every 5 years if you have a Pap test in combination with an HPV test.  Discuss your test results, treatment options, and if necessary, the need for more tests with your health care provider. Vaccines Your health care provider  may recommend certain vaccines, such as:  Influenza vaccine. This is recommended every year.  Tetanus, diphtheria, and acellular pertussis (Tdap, Td) vaccine. You may need a Td booster every 10 years.  Varicella vaccine. You may need this if you have not been vaccinated.  HPV vaccine. If you are 67 or younger, you may need three doses over 6 months.  Measles, mumps, and rubella (MMR) vaccine. You may need at least one dose of MMR. You may also need a second dose.  Pneumococcal 13-valent conjugate (PCV13) vaccine. You may need this if you have certain conditions and were not previously vaccinated.  Pneumococcal polysaccharide (PPSV23) vaccine. You may need one or two doses if you smoke cigarettes or if you have certain conditions.  Meningococcal vaccine. One dose is recommended if you are age 14-21 years and a first-year college student living in a residence hall, or if you have one of several medical conditions. You may also need additional booster doses.  Hepatitis A vaccine. You may need this if you have certain conditions or if you travel or work in places where you may be exposed to hepatitis A.  Hepatitis B vaccine. You may need this if you have certain conditions or if you travel or work in places where you may be exposed to hepatitis B.  Haemophilus influenzae type b (Hib) vaccine. You may need this if you have certain risk factors.  Talk to your health care provider about which screenings and vaccines you need and how often you need them. This information is not intended to replace advice given to you by your health care provider. Make sure you discuss any questions you have with your health care provider. Document Released: 10/18/2001 Document Revised: 05/11/2016 Document Reviewed: 06/23/2015 Elsevier Interactive Patient Education  [employer].

## 2018-03-31 LAB — THYROID PANEL WITH TSH
Free Thyroxine Index: 2 (ref 1.2–4.9)
T3 UPTAKE RATIO: 25 % (ref 24–39)
T4, Total: 8 ug/dL (ref 4.5–12.0)
TSH: 1.32 u[IU]/mL (ref 0.450–4.500)

## 2018-03-31 LAB — LIPID PANEL
Chol/HDL Ratio: 3.7 ratio (ref 0.0–4.4)
Cholesterol, Total: 176 mg/dL (ref 100–199)
HDL: 48 mg/dL (ref >39)
LDL Calculated: 110 mg/dL — ABNORMAL HIGH (ref 0–99)
TRIGLYCERIDES: 88 mg/dL (ref 0–149)
VLDL Cholesterol Cal: 18 mg/dL (ref 5–40)

## 2018-03-31 LAB — CBC
HEMATOCRIT: 45.7 % (ref 34.0–46.6)
Hemoglobin: 15.7 g/dL (ref 11.1–15.9)
MCH: 31.8 pg (ref 26.6–33.0)
MCHC: 34.4 g/dL (ref 31.5–35.7)
MCV: 93 fL (ref 79–97)
Platelets: 221 10*3/uL (ref 150–450)
RBC: 4.93 x10E6/uL (ref 3.77–5.28)
RDW: 12.2 % — AB (ref 12.3–15.4)
WBC: 5.1 10*3/uL (ref 3.4–10.8)

## 2018-03-31 LAB — COMPREHENSIVE METABOLIC PANEL
A/G RATIO: 1.8 (ref 1.2–2.2)
ALK PHOS: 90 IU/L (ref 39–117)
ALT: 32 IU/L (ref 0–32)
AST: 20 IU/L (ref 0–40)
Albumin: 4.6 g/dL (ref 3.5–5.5)
BILIRUBIN TOTAL: 0.9 mg/dL (ref 0.0–1.2)
BUN/Creatinine Ratio: 12 (ref 9–23)
BUN: 11 mg/dL (ref 6–20)
CHLORIDE: 100 mmol/L (ref 96–106)
CO2: 24 mmol/L (ref 20–29)
Calcium: 9.6 mg/dL (ref 8.7–10.2)
Creatinine, Ser: 0.89 mg/dL (ref 0.57–1.00)
GFR calc Af Amer: 107 mL/min/{1.73_m2} (ref >59)
GFR calc non Af Amer: 93 mL/min/{1.73_m2} (ref >59)
GLOBULIN, TOTAL: 2.5 g/dL (ref 1.5–4.5)
Glucose: 89 mg/dL (ref 65–99)
POTASSIUM: 4.5 mmol/L (ref 3.5–5.2)
SODIUM: 138 mmol/L (ref 134–144)
Total Protein: 7.1 g/dL (ref 6.0–8.5)

## 2018-03-31 LAB — HEMOGLOBIN A1C
ESTIMATED AVERAGE GLUCOSE: 94 mg/dL
HEMOGLOBIN A1C: 4.9 % (ref 4.8–5.6)

## 2018-04-02 ENCOUNTER — Encounter: Payer: Self-pay | Admitting: Certified Nurse Midwife

## 2018-04-02 DIAGNOSIS — Z975 Presence of (intrauterine) contraceptive device: Secondary | ICD-10-CM | POA: Insufficient documentation

## 2018-04-02 DIAGNOSIS — Z6830 Body mass index (BMI) 30.0-30.9, adult: Secondary | ICD-10-CM | POA: Insufficient documentation

## 2018-04-05 LAB — CYTOLOGY - PAP
Chlamydia: NEGATIVE
Diagnosis: NEGATIVE
Neisseria Gonorrhea: NEGATIVE
Trichomonas: NEGATIVE

## 2019-02-13 IMAGING — CT CT ANGIO CHEST
1 of 6 series · 19 of 36 positions shown · IV contrast (APPLIED)
Comparison: None.

CLINICAL DATA: Acute onset of shortness of breath. Initial
encounter.

EXAM:
CT ANGIOGRAPHY CHEST WITH CONTRAST
TECHNIQUE: Multidetector CT imaging of the chest was performed using the
standard protocol during bolus administration of intravenous
contrast. Multiplanar CT image reconstructions and MIPs were
obtained to evaluate the vascular anatomy.
CONTRAST:  75 mL of Isovue 370 IV contrast

[Series 5: thins · axial · 0.66mm/px · z∈[-531,-321]mm · 19 of 234 slices shown]
[im 12/234  lung]
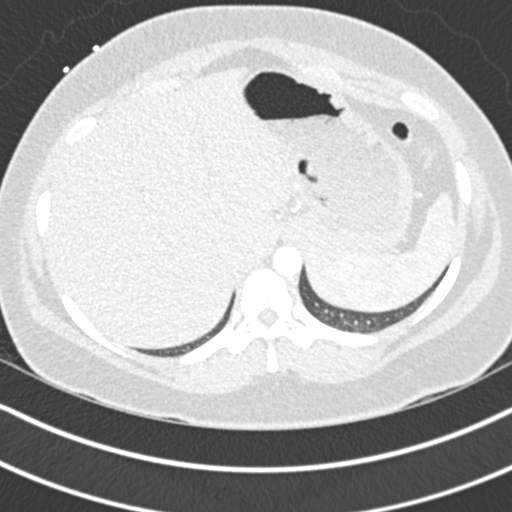
[im 24/234  mediastinal]
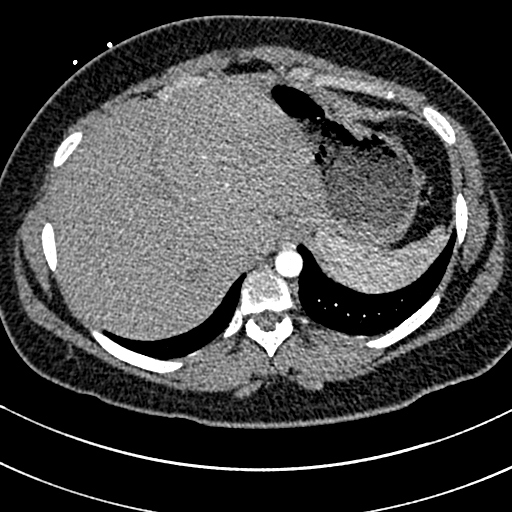
[im 35/234  lung]
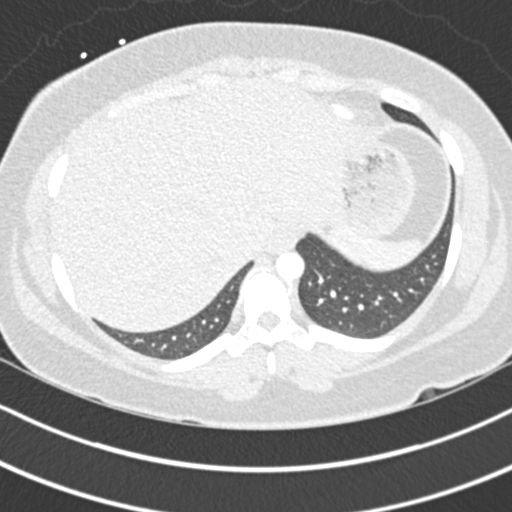
[im 47/234  mediastinal]
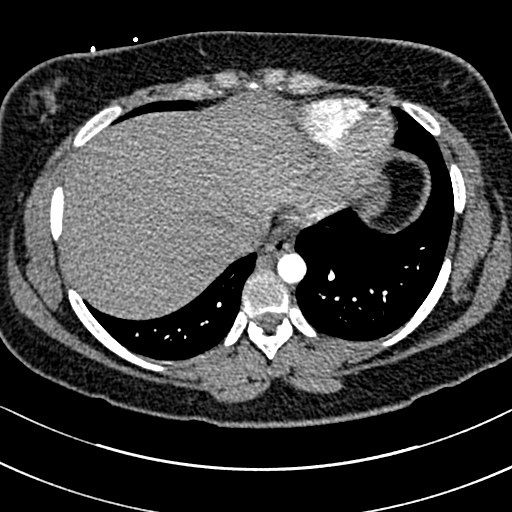
[im 59/234  lung]
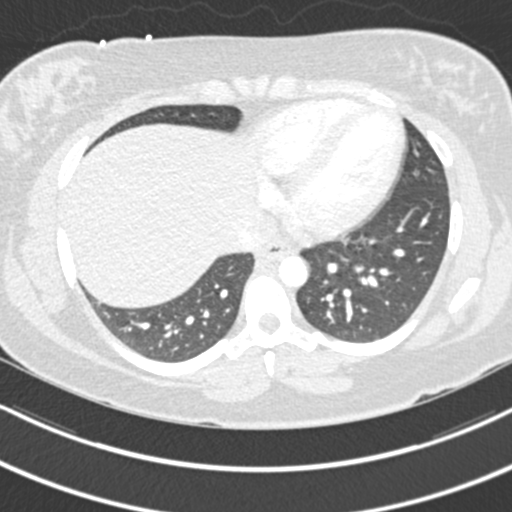
[im 70/234  mediastinal]
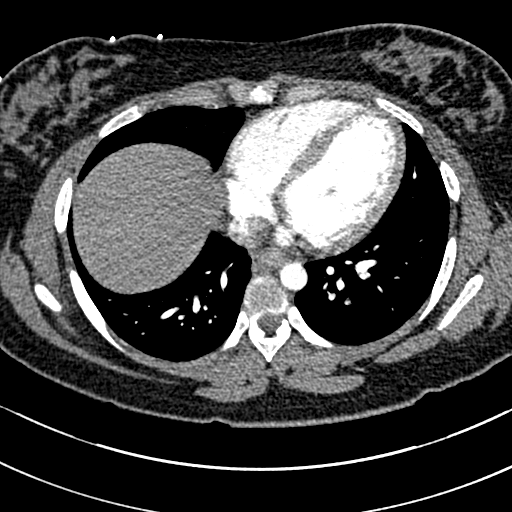
[im 82/234  lung]
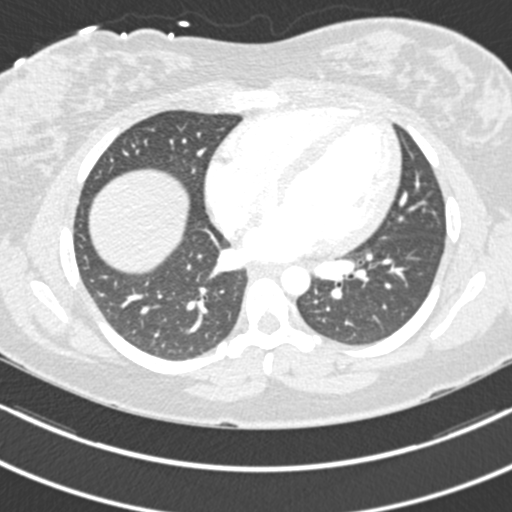
[im 94/234  mediastinal]
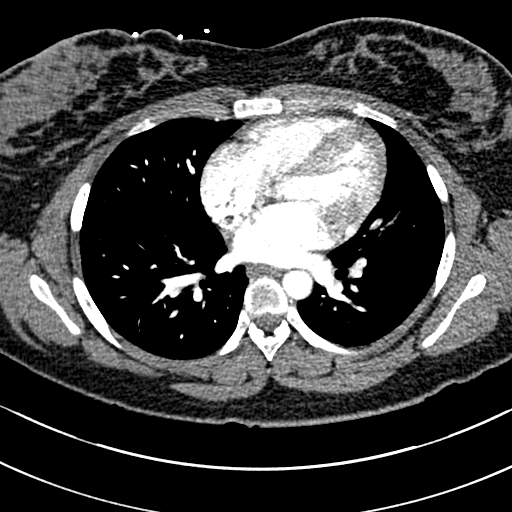
[im 105/234  lung]
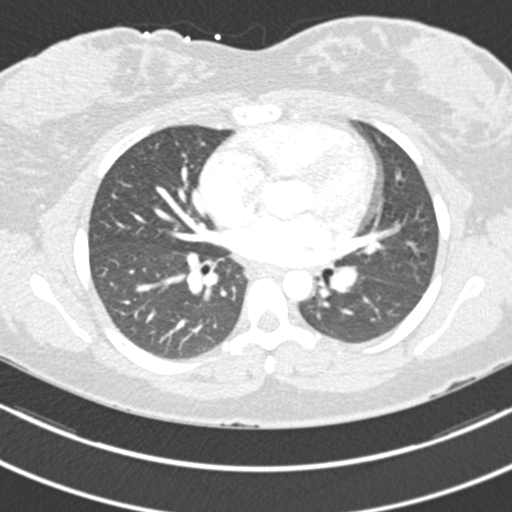
[im 117/234  mediastinal]
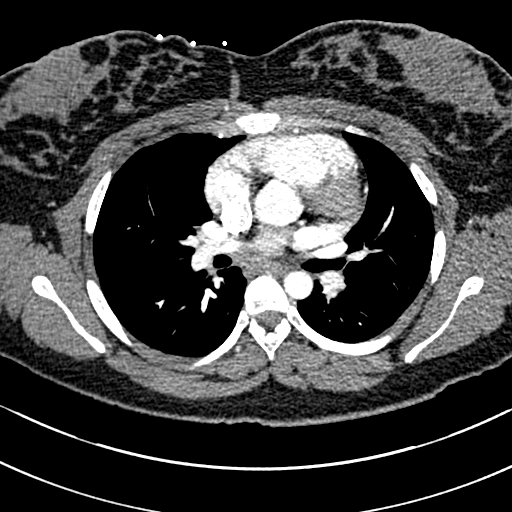
[im 129/234  lung]
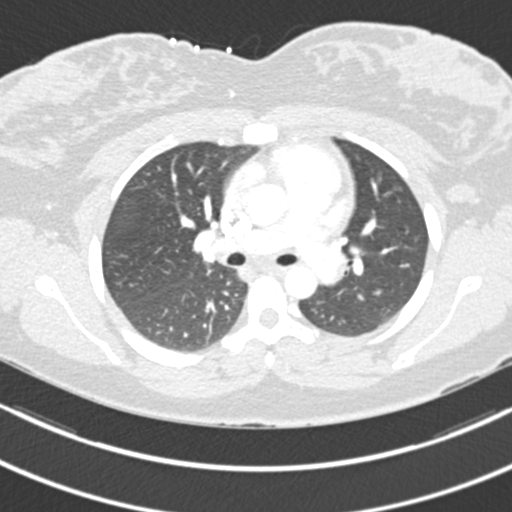
[im 140/234  mediastinal]
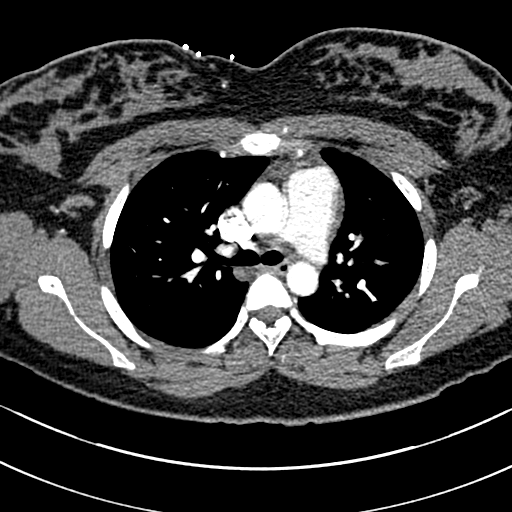
[im 152/234  lung]
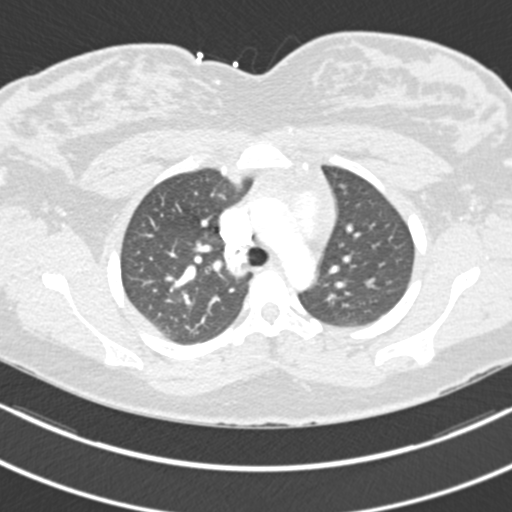
[im 164/234  mediastinal]
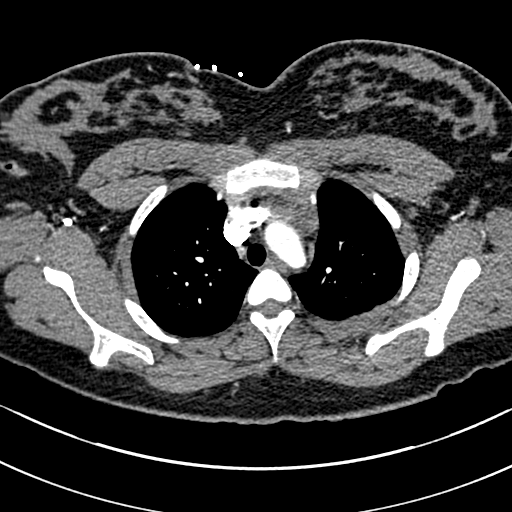
[im 175/234  lung]
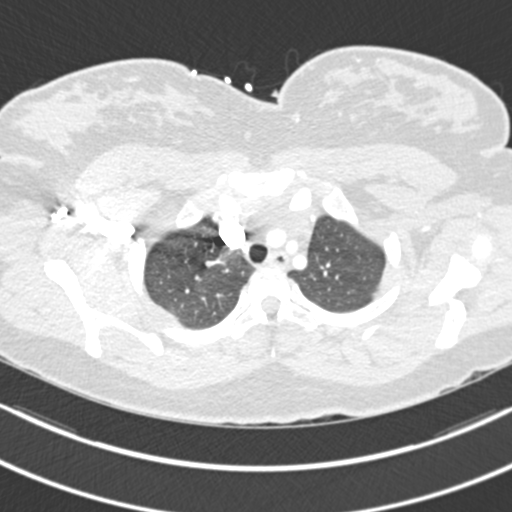
[im 187/234  mediastinal]
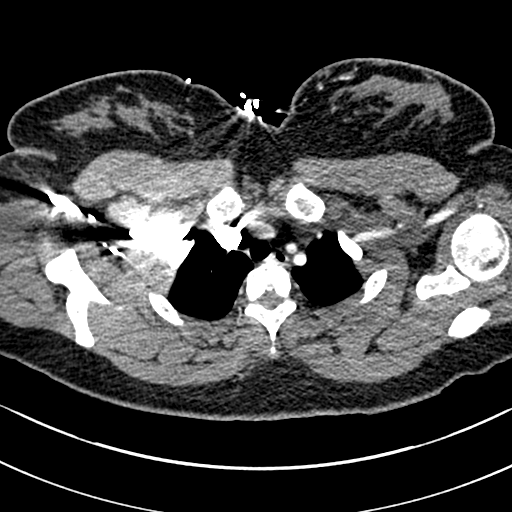
[im 199/234  lung]
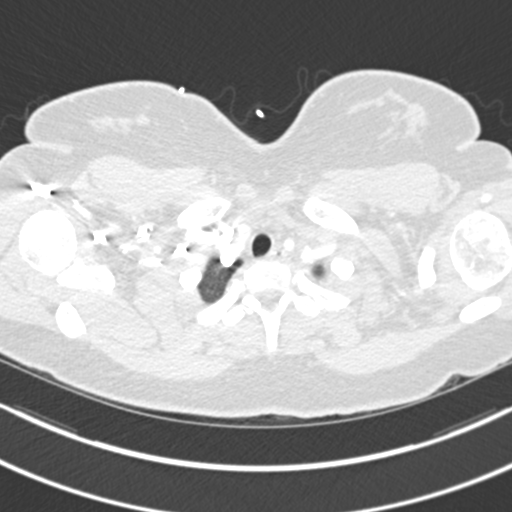
[im 210/234  mediastinal]
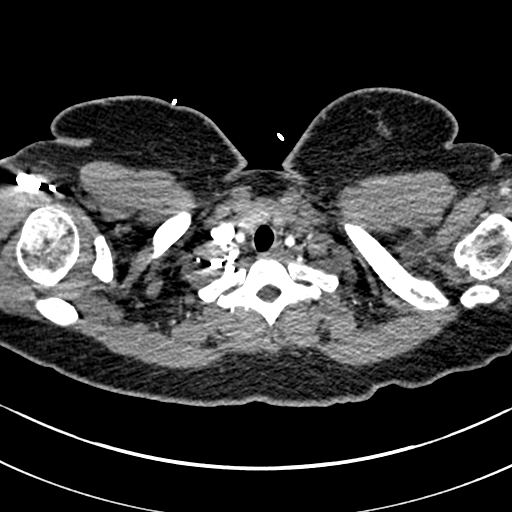
[im 222/234  lung]
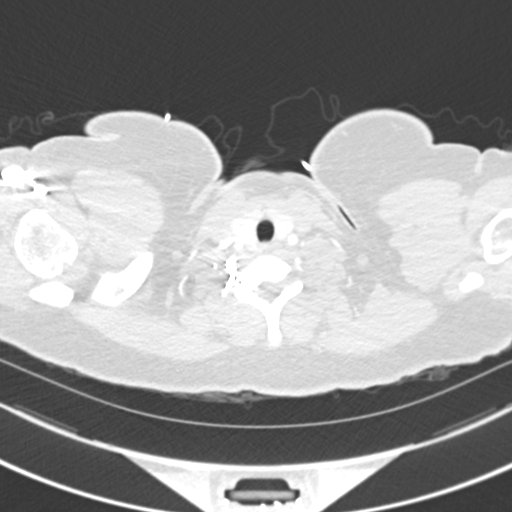

[19 of 36 positions shown; findings below may reference images not displayed]

FINDINGS: Cardiovascular:  There is no evidence of pulmonary embolus.

The heart is normal in size. The thoracic aorta is unremarkable in
appearance. The great vessels are within normal limits.

Mediastinum/Nodes: The mediastinum is unremarkable in appearance. No
mediastinal lymphadenopathy is seen. No pericardial effusion is
identified. The visualized portions of the thyroid gland are
unremarkable in appearance. No axillary lymphadenopathy is
appreciated.

Lungs/Pleura: The lungs are clear bilaterally. No focal
consolidation, pleural effusion or pneumothorax is seen. No masses
are identified.

Upper Abdomen: The visualized portions of the liver and spleen are
grossly unremarkable.

Musculoskeletal: No acute osseous abnormalities are identified. The
visualized musculature is unremarkable in appearance.

Review of the MIP images confirms the above findings.
IMPRESSION: No evidence of pulmonary embolus.  Lungs clear bilaterally.

## 2019-12-17 DIAGNOSIS — S93402A Sprain of unspecified ligament of left ankle, initial encounter: Secondary | ICD-10-CM | POA: Diagnosis not present

## 2020-01-28 ENCOUNTER — Ambulatory Visit: Payer: Medicaid Other | Attending: Internal Medicine

## 2020-01-28 DIAGNOSIS — Z23 Encounter for immunization: Secondary | ICD-10-CM

## 2020-01-28 NOTE — Progress Notes (Signed)
° °  Covid-19 Vaccination Clinic  Name:  Autumn Williams    MRN: [id number] DOB: 05/29/1996  01/28/2020  [name] was observed post Covid-19 immunization for 15 minutes without incident. She was provided with Vaccine Information Sheet and instruction to access the V-Safe system.   Ms. Sample was instructed to call 911 with any severe reactions post vaccine:  Difficulty breathing   Swelling of face and throat   A fast heartbeat   A bad rash all over body   Dizziness and weakness   Immunizations Administered    Name Date Dose VIS Date Route   Pfizer COVID-19 Vaccine 01/28/2020  2:21 PM 0.3 mL 10/30/2018 Intramuscular   Manufacturer: [employer], [employer]   Lot: [id number]   NDC: [address]-0

## 2020-02-18 ENCOUNTER — Ambulatory Visit: Payer: Medicaid Other | Attending: Internal Medicine

## 2020-02-25 ENCOUNTER — Ambulatory Visit: Payer: Medicaid Other | Attending: Internal Medicine

## 2020-02-25 DIAGNOSIS — Z23 Encounter for immunization: Secondary | ICD-10-CM

## 2020-02-25 NOTE — Progress Notes (Signed)
   Covid-19 Vaccination Clinic  Name:  Autumn Williams    MRN: [id number] DOB: 1996-04-08  02/25/2020  [name] was observed post Covid-19 immunization for 15 minutes without incident. She was provided with Vaccine Information Sheet and instruction to access the V-Safe system.   [name] was instructed to call 911 with any severe reactions post vaccine: Marland Kitchen Difficulty breathing  . Swelling of face and throat  . A fast heartbeat  . A bad rash all over body  . Dizziness and weakness   Immunizations Administered    Name Date Dose VIS Date Route   Pfizer COVID-19 Vaccine 02/25/2020 12:26 PM 0.3 mL 10/30/2018 Intramuscular   Manufacturer: [employer], [employer]   Lot: [id number]   NDC: [address]-8

## 2021-04-21 ENCOUNTER — Other Ambulatory Visit: Payer: Self-pay

## 2021-04-21 ENCOUNTER — Encounter: Payer: Self-pay | Admitting: Certified Nurse Midwife

## 2021-04-21 ENCOUNTER — Ambulatory Visit (INDEPENDENT_AMBULATORY_CARE_PROVIDER_SITE_OTHER): Payer: Medicaid Other | Admitting: Certified Nurse Midwife

## 2021-04-21 ENCOUNTER — Other Ambulatory Visit (HOSPITAL_COMMUNITY)
Admission: RE | Admit: 2021-04-21 | Discharge: 2021-04-21 | Disposition: A | Discharge status: Home / Self Care | Payer: Medicaid Other | Source: Ambulatory Visit | Attending: Certified Nurse Midwife | Admitting: Certified Nurse Midwife

## 2021-04-21 VITALS — BP 119/84 | HR 67 | Resp 16 | Ht 64.0 in | Wt 172.5 lb

## 2021-04-21 DIAGNOSIS — Z01419 Encounter for gynecological examination (general) (routine) without abnormal findings: Secondary | ICD-10-CM

## 2021-04-21 DIAGNOSIS — Z1159 Encounter for screening for other viral diseases: Secondary | ICD-10-CM | POA: Diagnosis not present

## 2021-04-21 DIAGNOSIS — Z124 Encounter for screening for malignant neoplasm of cervix: Secondary | ICD-10-CM | POA: Insufficient documentation

## 2021-04-21 DIAGNOSIS — Z23 Encounter for immunization: Secondary | ICD-10-CM | POA: Diagnosis not present

## 2021-04-21 NOTE — Progress Notes (Signed)
GYNECOLOGY ANNUAL PREVENTATIVE CARE ENCOUNTER NOTE  History:     Parlee Amescua is a 25 y.o. G34P1001 female here for a routine annual gynecologic exam.  Current complaints: none.   Denies abnormal vaginal bleeding, discharge, pelvic pain, problems with intercourse or other gynecologic concerns.     Social Relationship: in relationship female partner Living: with her sister Work: Press photographer  Exercise: 2-3 times wk x 45 min Smoke/Alcohol/drug use: Denies smoking/vaping, occasional alcohol, Marijuana use few x wk.   Gynecologic History Patient's last menstrual period was 04/13/2021 (exact date). Contraception: IUD Last Pap: 03/30/2018. Results were: normal  Last mammogram: n/a .   Upstream - 04/21/21 1415       Pregnancy Intention Screening   Does the patient want to become pregnant in the next year? No    Does the patient's partner want to become pregnant in the next year? No    Would the patient like to discuss contraceptive options today? No      Contraception Wrap Up   Current Method IUD or IUS    End Method IUD or IUS    Contraception Counseling Provided No            The pregnancy intention screening data noted above was reviewed. Potential methods of contraception were discussed. The patient elected to proceed with IUD or IUS.   Obstetric History OB History  Gravida Para Term Preterm AB Living  1 1 1     1   SAB IAB Ectopic Multiple Live Births        0 1    # Outcome Date GA Lbr Len/2nd Weight Sex Delivery Anes PTL Lv  1 Term 04/23/17 [redacted]w[redacted]d 12:56 / 00:35 7 lb 12.5 oz (3.53 kg) F Vag-Spont EPI  LIV     Birth Comments: natal teeth present at delivery    Past Medical History:  Diagnosis Date   Amenorrhea     Past Surgical History:  Procedure Laterality Date   NO PAST SURGERIES     none      No current outpatient medications on file prior to visit.   Current Facility-Administered Medications on File Prior to Visit  Medication Dose  Route Frequency Provider Last Rate Last Admin   levonorgestrel (MIRENA) 20 MCG/24HR IUD   Intrauterine Once Shambley, Melody N, CNM        No Known Allergies  Social History:  reports that she quit smoking about 4 years ago. Her smoking use included cigarettes. She has never used smokeless tobacco. She reports current alcohol use. She reports that she does not use drugs.  Family History  Problem Relation Age of Onset   Cancer Neg Hx    Diabetes Neg Hx    Heart failure Neg Hx     The following portions of the patient's history were reviewed and updated as appropriate: allergies, current medications, past family history, past medical history, past social history, past surgical history and problem list.  Review of Systems Pertinent items noted in HPI and remainder of comprehensive ROS otherwise negative.  Physical Exam:  BP 119/84   Pulse 67   Resp 16   Ht 5\' 4"  (1.626 m)   Wt 172 lb 8 oz (78.2 kg)   LMP 04/13/2021 (Exact Date)   BMI 29.61 kg/m  CONSTITUTIONAL: Well-developed, well-nourished female in no acute distress.  HENT:  Normocephalic, atraumatic, External right and left ear normal. Oropharynx is clear and moist EYES: Conjunctivae and EOM are normal. Pupils are equal,  round, and reactive to light. No scleral icterus.  NECK: Normal range of motion, supple, no masses.  Normal thyroid.  SKIN: Skin is warm and dry. No rash noted. Not diaphoretic. No erythema. No pallor. MUSCULOSKELETAL: Normal range of motion. No tenderness.  No cyanosis, clubbing, or edema.  2+ distal pulses. NEUROLOGIC: Alert and oriented to person, place, and time. Normal reflexes, muscle tone coordination.  PSYCHIATRIC: Normal mood and affect. Normal behavior. Normal judgment and thought content. CARDIOVASCULAR: Normal heart rate noted, regular rhythm RESPIRATORY: Clear to auscultation bilaterally. Effort and breath sounds normal, no problems with respiration noted. BREASTS: Symmetric in size. No masses,  tenderness, skin changes, nipple drainage, or lymphadenopathy bilaterally.  ABDOMEN: Soft, no distention noted.  No tenderness, rebound or guarding.  PELVIC: Normal appearing external genitalia and urethral meatus; normal appearing vaginal mucosa and cervix.  No abnormal discharge noted.  Pap smear obtained.  Strings present in side cervix. Normal uterine size, no other palpable masses, no uterine or adnexal tenderness.  .   Assessment and Plan:    1. Cervical cancer screening   2. Women's annual routine gynecological examination    Pap: Will follow up results of pap smear and manage accordingly. Mammogram : n/a  Labs: hep c Refills/orders : HPV vaccine first dose today Referral: none Routine preventative health maintenance measures emphasized. Please refer to After Visit Summary for other counseling recommendations.      Doreene Burke, CNM [facility],  [facility]

## 2021-04-21 NOTE — Addendum Note (Signed)
Addended by: Tommie Raymond on: 04/21/2021 02:55 PM   Modules accepted: Orders

## 2021-04-22 LAB — HEPATITIS C ANTIBODY: Hep C Virus Ab: 0.1 s/co ratio (ref 0.0–0.9)

## 2021-04-30 ENCOUNTER — Other Ambulatory Visit: Payer: Self-pay | Admitting: Certified Nurse Midwife

## 2021-04-30 LAB — CYTOLOGY - PAP
Chlamydia: POSITIVE — AB
Comment: NEGATIVE
Comment: NEGATIVE
Comment: NEGATIVE
Comment: NORMAL
HPV 16: NEGATIVE
HPV 18 / 45: NEGATIVE
High risk HPV: POSITIVE — AB
Neisseria Gonorrhea: NEGATIVE

## 2021-04-30 MED ORDER — AZITHROMYCIN 500 MG PO TABS: 1000.0000 mg | ORAL_TABLET | Freq: Once | ORAL | 0 refills | Status: AC

## 2021-05-13 ENCOUNTER — Other Ambulatory Visit (HOSPITAL_COMMUNITY)
Admission: RE | Admit: 2021-05-13 | Discharge: 2021-05-13 | Disposition: A | Discharge status: Home / Self Care | Payer: Medicaid Other | Source: Ambulatory Visit | Attending: Obstetrics and Gynecology | Admitting: Obstetrics and Gynecology

## 2021-05-13 ENCOUNTER — Other Ambulatory Visit: Payer: Self-pay

## 2021-05-13 ENCOUNTER — Encounter: Payer: Self-pay | Admitting: Obstetrics and Gynecology

## 2021-05-13 ENCOUNTER — Ambulatory Visit (INDEPENDENT_AMBULATORY_CARE_PROVIDER_SITE_OTHER): Payer: Medicaid Other | Admitting: Obstetrics and Gynecology

## 2021-05-13 VITALS — BP 115/81 | HR 66 | Resp 16 | Ht 64.0 in | Wt 177.5 lb

## 2021-05-13 DIAGNOSIS — R87612 Low grade squamous intraepithelial lesion on cytologic smear of cervix (LGSIL): Secondary | ICD-10-CM

## 2021-05-13 DIAGNOSIS — B977 Papillomavirus as the cause of diseases classified elsewhere: Secondary | ICD-10-CM | POA: Diagnosis not present

## 2021-05-13 DIAGNOSIS — N72 Inflammatory disease of cervix uteri: Secondary | ICD-10-CM | POA: Insufficient documentation

## 2021-05-13 NOTE — Addendum Note (Signed)
Addended by: Tommie Raymond on: 05/13/2021 10:43 AM   Modules accepted: Orders

## 2021-05-13 NOTE — Progress Notes (Addendum)
Referring Provider: Janee Morn  HPI:  Autumn Williams is a 25 y.o.  G1P1001  who presents today for evaluation and management of abnormal cervical cytology.    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 She recently had chlamydia and she and her partner were treated appropriately.  (Approximately 3 weeks ago)  Has not yet had test of cure.  ROS:  Pertinent items noted in HPI and remainder of comprehensive ROS otherwise negative.  OB History  Gravida Para Term Preterm AB Living  1 1 1     1   SAB IAB Ectopic Multiple Live Births        0 1    # Outcome Date GA Lbr Len/2nd Weight Sex Delivery Anes PTL Lv  1 Term 04/23/17 [redacted]w[redacted]d 12:56 / 00:35 7 lb 12.5 oz (3.53 kg) F Vag-Spont EPI  LIV     Birth Comments: natal teeth present at delivery    Past Medical History:  Diagnosis Date   Amenorrhea     Past Surgical History:  Procedure Laterality Date   NO PAST SURGERIES     none      SOCIAL HISTORY:  Social History   Substance and Sexual Activity  Alcohol Use Yes   Comment: occ    Social History   Substance and Sexual Activity  Drug Use No     Family History  Problem Relation Age of Onset   Cancer Neg Hx    Diabetes Neg Hx    Heart failure Neg Hx     ALLERGIES:  Patient has no known allergies.  She has a current medication list which includes the following Facility-Administered Medications: levonorgestrel.  Physical Exam: -Vitals:  BP 115/81   Pulse 66   Resp 16   Ht 5\' 4"  (1.626 m)   Wt 177 lb 8 oz (80.5 kg)   LMP 04/13/2021 (Exact Date)   BMI 30.47 kg/m   PROCEDURE: Colposcopy performed with 4% acetic acid after verbal consent obtained                           -Aceto-white Lesions Location(s): See above              -Biopsy performed at 7 and 11 o'clock               -ECC indicated and performed: No.     -Biopsy sites made hemostatic with pressure and Monsel's solution   -Satisfactory colposcopy:  Yes.      -Evidence of Invasive cervical CA :  NO  ASSESSMENT:  Autumn Williams is a 25 y.o. G1P1001 here for  1. Low grade squamous intraepithelial lesion on cytologic smear of cervix (LGSIL)   2. High risk human papilloma virus (HPV) infection of cervix   .  PLAN: 1.  I discussed the grading system of pap smears and HPV high risk viral types.  We will discuss management after colpo results return. 2.  I discussed smoking and its effect on the immune system specifically in regard to HPV.  I have advised her to discontinue tobacco use.  No orders of the defined types were placed in this encounter.          F/U  Return in about 2 weeks (around 05/27/2021). I spent 14 minutes involved in the care of this patient preparing to see the patient by obtaining and reviewing her medical history (including labs, imaging  tests and prior procedures), documenting clinical information in the electronic health record (EHR), counseling and coordinating care plans, writing and sending prescriptions, ordering tests or procedures and in direct communicating with the patient and medical staff discussing pertinent items from her history and physical exam.  Brennan Bailey ,MD 05/13/2021,8:38 AM

## 2021-05-14 LAB — SURGICAL PATHOLOGY

## 2021-06-02 ENCOUNTER — Encounter: Payer: Self-pay | Admitting: Obstetrics and Gynecology

## 2021-06-02 ENCOUNTER — Ambulatory Visit (INDEPENDENT_AMBULATORY_CARE_PROVIDER_SITE_OTHER): Payer: Medicaid Other | Admitting: Obstetrics and Gynecology

## 2021-06-02 ENCOUNTER — Other Ambulatory Visit: Payer: Self-pay

## 2021-06-02 VITALS — BP 124/83 | HR 66 | Ht 64.0 in | Wt 178.2 lb

## 2021-06-02 DIAGNOSIS — N870 Mild cervical dysplasia: Secondary | ICD-10-CM

## 2021-06-02 NOTE — Progress Notes (Signed)
Pt present for colpo follow up. Pt stated that since the colpo she has been having discomfort in the vaginal area.

## 2021-06-02 NOTE — Progress Notes (Signed)
HPI:      Ms. Autumn Williams is a 25 y.o. G1P1001 who LMP was No LMP recorded (exact date). (Menstrual status: IUD).  Subjective:   She presents today for her follow-up after colposcopy.  Her first Pap smear was LGSIL with positive HPV.  She underwent colposcopy approximately 4 weeks ago.    Hx: The following portions of the patient's history were reviewed and updated as appropriate:             She  has a past medical history of Abnormal Pap smear of cervix and Amenorrhea. She does not have any pertinent problems on file. She  has a past surgical history that includes none; No past surgeries; and Colposcopy. Her family history includes Healthy in her father, maternal grandfather, maternal grandmother, and mother; Hypertension in her paternal grandfather and paternal grandmother. She  reports that she quit smoking about 4 years ago. Her smoking use included cigarettes. She has never used smokeless tobacco. She reports current alcohol use. She reports current drug use. Drug: Marijuana. She has a current medication list which includes the following prescription(s): levonorgestrel, and the following Facility-Administered Medications: levonorgestrel. She has No Known Allergies.       Review of Systems:  Review of Systems  Constitutional: Denied constitutional symptoms, night sweats, recent illness, fatigue, fever, insomnia and weight loss.  Eyes: Denied eye symptoms, eye pain, photophobia, vision change and visual disturbance.  Ears/Nose/Throat/Neck: Denied ear, nose, throat or neck symptoms, hearing loss, nasal discharge, sinus congestion and sore throat.  Cardiovascular: Denied cardiovascular symptoms, arrhythmia, chest pain/pressure, edema, exercise intolerance, orthopnea and palpitations.  Respiratory: Denied pulmonary symptoms, asthma, pleuritic pain, productive sputum, cough, dyspnea and wheezing.  Gastrointestinal: Denied, gastro-esophageal reflux, melena, nausea and vomiting.   Genitourinary: Denied genitourinary symptoms including symptomatic vaginal discharge, pelvic relaxation issues, and urinary complaints.  Musculoskeletal: Denied musculoskeletal symptoms, stiffness, swelling, muscle weakness and myalgia.  Dermatologic: Denied dermatology symptoms, rash and scar.  Neurologic: Denied neurology symptoms, dizziness, headache, neck pain and syncope.  Psychiatric: Denied psychiatric symptoms, anxiety and depression.  Endocrine: Denied endocrine symptoms including hot flashes and night sweats.   Meds:   Current Outpatient Medications on File Prior to Visit  Medication Sig Dispense Refill   Levonorgestrel (MIRENA, 52 MG, IU) by Intrauterine route.     Current Facility-Administered Medications on File Prior to Visit  Medication Dose Route Frequency Provider Last Rate Last Admin   levonorgestrel (MIRENA) 20 MCG/24HR IUD   Intrauterine Once Dermott, Melody N, CNM          Objective:     Vitals:   06/02/21 1136  BP: 124/83  Pulse: 66   Filed Weights   06/02/21 1136  Weight: 178 lb 3.2 oz (80.8 kg)              CIN-1 by colposcopically directed biopsies          Assessment:    G1P1001 Patient Active Problem List   Diagnosis Date Noted   IUD (intrauterine device) in place 04/02/2018   BMI 30.0-30.9,adult 04/02/2018     1. Dysplasia of cervix, low grade (CIN 1)        Plan:            1.  I discussed the natural course and history of CIN-1 and HPV.  All questions answered.  2.  Recommend Pap Co-test in 1 year per ASCCP guidelines.     Orders No orders of the defined types were placed in this encounter.  No orders of the defined types were placed in this encounter.     F/U  Return in about 1 year (around 06/02/2022). I spent 13 minutes involved in the care of this patient preparing to see the patient by obtaining and reviewing her medical history (including labs, imaging tests and prior procedures), documenting clinical information  in the electronic health record (EHR), counseling and coordinating care plans, writing and sending prescriptions, ordering tests or procedures and in direct communicating with the patient and medical staff discussing pertinent items from her history and physical exam.  Elonda Husky, M.D. 06/02/2021 12:02 PM

## 2021-06-21 ENCOUNTER — Other Ambulatory Visit: Payer: Self-pay

## 2021-06-21 ENCOUNTER — Ambulatory Visit (INDEPENDENT_AMBULATORY_CARE_PROVIDER_SITE_OTHER): Payer: Medicaid Other | Admitting: Obstetrics and Gynecology

## 2021-06-21 DIAGNOSIS — Z23 Encounter for immunization: Secondary | ICD-10-CM | POA: Diagnosis not present

## 2021-10-11 ENCOUNTER — Ambulatory Visit (INDEPENDENT_AMBULATORY_CARE_PROVIDER_SITE_OTHER): Payer: Medicaid Other | Admitting: Obstetrics and Gynecology

## 2021-10-11 ENCOUNTER — Other Ambulatory Visit: Payer: Self-pay

## 2021-10-11 VITALS — BP 109/80 | HR 64 | Ht 64.0 in | Wt 174.0 lb

## 2021-10-11 DIAGNOSIS — Z23 Encounter for immunization: Secondary | ICD-10-CM | POA: Diagnosis not present

## 2021-10-11 NOTE — Progress Notes (Signed)
Administered to pt --3rd shot of Human Papillomavirus 9-valent  Vaccine left deltoid. Pt tolerated well.

## 2021-10-12 ENCOUNTER — Ambulatory Visit: Payer: Medicaid Other

## 2022-06-03 ENCOUNTER — Encounter: Payer: Medicaid Other | Admitting: Obstetrics and Gynecology
